# Patient Record
Sex: Male | Born: 1955 | Race: White | Hispanic: No | Marital: Married | Smoking: Current every day smoker
Health system: Southern US, Community
[De-identification: ages and names within clinical notes are randomized; demographics above are authoritative.]

## PROBLEM LIST (undated history)

## (undated) DIAGNOSIS — I639 Cerebral infarction, unspecified: Secondary | ICD-10-CM

## (undated) DIAGNOSIS — F329 Major depressive disorder, single episode, unspecified: Secondary | ICD-10-CM

## (undated) DIAGNOSIS — E785 Hyperlipidemia, unspecified: Secondary | ICD-10-CM

## (undated) DIAGNOSIS — F32A Depression, unspecified: Secondary | ICD-10-CM

## (undated) DIAGNOSIS — I10 Essential (primary) hypertension: Secondary | ICD-10-CM

## (undated) DIAGNOSIS — J449 Chronic obstructive pulmonary disease, unspecified: Secondary | ICD-10-CM

## (undated) HISTORY — PX: OTHER SURGICAL HISTORY: SHX169

## (undated) HISTORY — DX: Chronic obstructive pulmonary disease, unspecified: J44.9

## (undated) HISTORY — DX: Hyperlipidemia, unspecified: E78.5

## (undated) HISTORY — DX: Essential (primary) hypertension: I10

## (undated) HISTORY — DX: Cerebral infarction, unspecified: I63.9

## (undated) HISTORY — PX: APPENDECTOMY: SHX54

## (undated) HISTORY — DX: Major depressive disorder, single episode, unspecified: F32.9

## (undated) HISTORY — DX: Depression, unspecified: F32.A

---

## 2011-02-24 DIAGNOSIS — F32A Depression, unspecified: Secondary | ICD-10-CM | POA: Diagnosis present

## 2011-02-24 DIAGNOSIS — J439 Emphysema, unspecified: Secondary | ICD-10-CM | POA: Diagnosis present

## 2011-02-24 DIAGNOSIS — I10 Essential (primary) hypertension: Secondary | ICD-10-CM | POA: Diagnosis present

## 2011-02-24 DIAGNOSIS — Z8673 Personal history of transient ischemic attack (TIA), and cerebral infarction without residual deficits: Secondary | ICD-10-CM | POA: Insufficient documentation

## 2013-01-25 DIAGNOSIS — M5135 Other intervertebral disc degeneration, thoracolumbar region: Secondary | ICD-10-CM

## 2013-01-25 HISTORY — DX: Other intervertebral disc degeneration, thoracolumbar region: M51.35

## 2014-04-17 DIAGNOSIS — E785 Hyperlipidemia, unspecified: Secondary | ICD-10-CM | POA: Insufficient documentation

## 2014-04-17 DIAGNOSIS — I5189 Other ill-defined heart diseases: Secondary | ICD-10-CM | POA: Insufficient documentation

## 2017-05-06 DIAGNOSIS — F3341 Major depressive disorder, recurrent, in partial remission: Secondary | ICD-10-CM | POA: Insufficient documentation

## 2018-05-17 ENCOUNTER — Other Ambulatory Visit: Payer: Self-pay | Admitting: Family Medicine

## 2018-05-17 DIAGNOSIS — Z0000 Encounter for general adult medical examination without abnormal findings: Secondary | ICD-10-CM

## 2018-05-17 DIAGNOSIS — Z87891 Personal history of nicotine dependence: Secondary | ICD-10-CM

## 2018-05-17 DIAGNOSIS — Z122 Encounter for screening for malignant neoplasm of respiratory organs: Secondary | ICD-10-CM

## 2018-05-23 ENCOUNTER — Telehealth: Payer: Self-pay | Admitting: *Deleted

## 2018-05-23 DIAGNOSIS — Z87891 Personal history of nicotine dependence: Secondary | ICD-10-CM

## 2018-05-23 DIAGNOSIS — Z122 Encounter for screening for malignant neoplasm of respiratory organs: Secondary | ICD-10-CM

## 2018-05-23 NOTE — Telephone Encounter (Signed)
Received referral for initial lung cancer screening scan. Contacted patient and obtained smoking history,(current, 34.5 pack year) as well as answering questions related to screening process. Patient denies signs of lung cancer such as weight loss or hemoptysis. Patient denies comorbidity that would prevent curative treatment if lung cancer were found. Patient is scheduled for shared decision making visit and CT scan on 06/07/18 at 130pm.

## 2018-06-07 ENCOUNTER — Ambulatory Visit
Admission: RE | Admit: 2018-06-07 | Discharge: 2018-06-07 | Disposition: A | Discharge status: Home / Self Care | Payer: Medicare Other | Source: Ambulatory Visit | Attending: Nurse Practitioner | Admitting: Nurse Practitioner

## 2018-06-07 ENCOUNTER — Inpatient Hospital Stay: Payer: Medicare Other | Attending: Nurse Practitioner | Admitting: Nurse Practitioner

## 2018-06-07 ENCOUNTER — Encounter: Payer: Self-pay | Admitting: Nurse Practitioner

## 2018-06-07 DIAGNOSIS — Z122 Encounter for screening for malignant neoplasm of respiratory organs: Secondary | ICD-10-CM

## 2018-06-07 DIAGNOSIS — Z87891 Personal history of nicotine dependence: Secondary | ICD-10-CM | POA: Diagnosis not present

## 2018-06-07 NOTE — Progress Notes (Signed)
In accordance with CMS guidelines, patient has met eligibility criteria including age, absence of signs or symptoms of lung cancer.  Social History   Tobacco Use  . Smoking status: Current Every Day Smoker    Packs/day: 0.75    Years: 46.00    Pack years: 34.50    Types: Cigarettes  Substance Use Topics  . Alcohol use: Not on file  . Drug use: Not on file      A shared decision-making session was conducted prior to the performance of CT scan. This includes one or more decision aids, includes benefits and harms of screening, follow-up diagnostic testing, over-diagnosis, false positive rate, and total radiation exposure.   Counseling on the importance of adherence to annual lung cancer LDCT screening, impact of co-morbidities, and ability or willingness to undergo diagnosis and treatment is imperative for compliance of the program.   Counseling on the importance of continued smoking cessation for former smokers; the importance of smoking cessation for current smokers, and information about tobacco cessation interventions have been given to patient including [place] and [facility] quit [place] programs.   Written order for lung cancer screening with LDCT has been given to the patient and any and all questions have been answered to the best of my abilities.    Yearly follow up will be coordinated by Burgess Estelle, Thoracic Navigator.  Beckey Rutter, DNP, AGNP-C [place] [facility] [phone] (work cell) [phone] (office) 06/07/18 2:25 PM

## 2018-06-09 ENCOUNTER — Telehealth: Payer: Self-pay | Admitting: *Deleted

## 2018-06-09 ENCOUNTER — Other Ambulatory Visit: Payer: Self-pay | Admitting: *Deleted

## 2018-06-09 ENCOUNTER — Encounter: Payer: Self-pay | Admitting: Nurse Practitioner

## 2018-06-09 DIAGNOSIS — R911 Solitary pulmonary nodule: Secondary | ICD-10-CM

## 2018-06-09 NOTE — Telephone Encounter (Signed)
Notified patient of LDCT lung cancer screening program results with recommendation for PET scan. Also notified of incidental findings noted below and is encouraged to discuss further with PCP who will receive a copy of this note and/or the CT report. Patient verbalizes understanding. This plan was discussed with Dr. Iona Beard, who is in agreement with the plan.    IMPRESSION: 1. Lung-RADS 4AS, suspicious. Further evaluation with PET-CT is recommended in the near future to better evaluate a nodular area in the left upper lobe which is associated with the wall of a cyst. 2. The "S" modifier above refers to potentially clinically significant non lung cancer related findings. Specifically, there is aortic atherosclerosis, in addition to 2 vessel coronary artery disease. Please note that although the presence of coronary artery calcium documents the presence of coronary artery disease, the severity of this disease and any potential stenosis cannot be assessed on this non-gated CT examination. Assessment for potential risk factor modification, dietary therapy or pharmacologic therapy may be warranted, if clinically indicated. 3. Diffuse bronchial wall thickening with moderate centrilobular and paraseptal emphysema; imaging findings suggestive of underlying COPD.  Aortic Atherosclerosis (ICD10-I70.0) and Emphysema (ICD10-J43.9).

## 2018-06-09 NOTE — Progress Notes (Signed)
Paul Branch, 63 y.o. male who was referred to the lung cancer screening program/low-dose CT for current use of tobacco products.  he had a low-dose chest CT on 06/07/2018 which noted several pulmonary nodules largest of which associated with the periphery of a cystic area in the left upper lobe with volume derived mean diameter of 10.8 mm.  Recommendation to follow-up with PET scan to further evaluate this finding and rule out primary bronchogenic carcinoma/underlying malignancy.  PET scan will assist in medical decision making including possible biopsy and to optimize management.  Patient is followed by nurse navigator, Burgess Estelle, RN and has been referred to cancer center at [facility]

## 2018-06-13 ENCOUNTER — Ambulatory Visit: Payer: Medicare Other

## 2018-06-15 DIAGNOSIS — I700 Atherosclerosis of aorta: Secondary | ICD-10-CM | POA: Insufficient documentation

## 2018-06-15 DIAGNOSIS — R911 Solitary pulmonary nodule: Secondary | ICD-10-CM | POA: Insufficient documentation

## 2018-06-16 ENCOUNTER — Ambulatory Visit: Payer: Medicare Other

## 2018-06-20 ENCOUNTER — Ambulatory Visit
Admission: RE | Admit: 2018-06-20 | Discharge: 2018-06-20 | Disposition: A | Discharge status: Home / Self Care | Payer: Medicare Other | Source: Ambulatory Visit | Attending: Nurse Practitioner | Admitting: Nurse Practitioner

## 2018-06-20 DIAGNOSIS — J432 Centrilobular emphysema: Secondary | ICD-10-CM | POA: Insufficient documentation

## 2018-06-20 DIAGNOSIS — R911 Solitary pulmonary nodule: Secondary | ICD-10-CM

## 2018-06-20 LAB — GLUCOSE, CAPILLARY: GLUCOSE-CAPILLARY: 74 mg/dL (ref 70–99)

## 2018-06-20 MED ORDER — FLUDEOXYGLUCOSE F - 18 (FDG) INJECTION
8.9000 | Freq: Once | INTRAVENOUS | Status: AC | PRN
  Administered 2018-06-20: 9.12 via INTRAVENOUS

## 2018-06-21 ENCOUNTER — Other Ambulatory Visit: Payer: Self-pay | Admitting: *Deleted

## 2018-06-21 DIAGNOSIS — R911 Solitary pulmonary nodule: Secondary | ICD-10-CM

## 2018-06-22 ENCOUNTER — Other Ambulatory Visit: Payer: Self-pay | Admitting: *Deleted

## 2018-06-22 DIAGNOSIS — R911 Solitary pulmonary nodule: Secondary | ICD-10-CM

## 2018-06-23 ENCOUNTER — Other Ambulatory Visit: Payer: Medicare Other

## 2018-06-23 ENCOUNTER — Ambulatory Visit: Payer: Medicare Other | Attending: Nurse Practitioner

## 2018-06-23 DIAGNOSIS — R911 Solitary pulmonary nodule: Secondary | ICD-10-CM | POA: Insufficient documentation

## 2018-06-23 DIAGNOSIS — J449 Chronic obstructive pulmonary disease, unspecified: Secondary | ICD-10-CM | POA: Insufficient documentation

## 2018-06-23 NOTE — Progress Notes (Signed)
Tumor Board Documentation  Paul Branch was presented by Army Chaco, RN at our Tumor Board on 06/23/2018, which included representatives from medical oncology, radiation oncology, surgical oncology, internal medicine, navigation, pathology, radiology, surgical, nutrition, research, pulmonology.  [place] currently presents as an external consult, for discussion with history of the following treatments: active survellience.  Additionally, we reviewed previous medical and familial history, history of present illness, and recent lab results along with all available histopathologic and imaging studies. The tumor board considered available treatment options and made the following recommendations: Active surveillance Surgical evaluation Dr Genevive Bi, if not surgical candidate, scan every 6 months  The following procedures/referrals were also placed: No orders of the defined types were placed in this encounter.   Clinical Trial Status: not discussed   Staging used:    National site-specific guidelines   were discussed with respect to the case.  Tumor board is a meeting of clinicians from various specialty areas who evaluate and discuss patients for whom a multidisciplinary approach is being considered. Final determinations in the plan of care are those of the provider(s). The responsibility for follow up of recommendations given during tumor board is that of the provider.   Today's extended care, comprehensive team conference, Morty was not present for the discussion and was not examined.   Multidisciplinary Tumor Board is a multidisciplinary case peer review process.  Decisions discussed in the Multidisciplinary Tumor Board reflect the opinions of the specialists present at the conference without having examined the patient.  Ultimately, treatment and diagnostic decisions rest with the primary provider(s) and the patient.

## 2018-06-24 ENCOUNTER — Other Ambulatory Visit: Payer: Self-pay

## 2018-06-24 ENCOUNTER — Ambulatory Visit (INDEPENDENT_AMBULATORY_CARE_PROVIDER_SITE_OTHER): Payer: Medicare Other | Admitting: Cardiothoracic Surgery

## 2018-06-24 ENCOUNTER — Encounter: Payer: Self-pay | Admitting: Cardiothoracic Surgery

## 2018-06-24 VITALS — BP 187/127 | HR 92 | Temp 97.5°F | Ht 72.0 in | Wt 176.2 lb

## 2018-06-24 DIAGNOSIS — J438 Other emphysema: Secondary | ICD-10-CM | POA: Diagnosis not present

## 2018-06-24 DIAGNOSIS — Z87891 Personal history of nicotine dependence: Secondary | ICD-10-CM

## 2018-06-24 NOTE — Progress Notes (Signed)
Patient ID: Paul Branch, male   DOB: Jan 11, 1956, 63 y.o.   MRN: [id number]  Chief Complaint  Patient presents with  . New Patient (Initial Visit)     new patient-lung nodule evaluation, referred by our NP Paul Branch    Referred By Paul Branch Reason for Referral left upper lobe cystic lesion with associated nodule  HPI Location, Quality, Duration, Severity, Timing, Context, Modifying Factors, Associated Signs and Symptoms.  Paul Branch is a 63 y.o. male.  He is a 63 year old African-American male with a longstanding smoking history who currently smokes.  He had a CT scan performed as part of the lung screening and that revealed severe upper lobe emphysematous changes and a nodule associated with the left upper lobe cystic lesion.  A PET scan was then performed which revealed uptake in that area.  The radiology report suggested a bronchoscopy for biopsy.  He had some pulmonary function studies done which reveal an FEV1 of 29% of predicted and a DLCO of 52% of predicted.  He states that he gets short of breath with minimal activities.   Past Medical History:  Diagnosis Date  . COPD (chronic obstructive pulmonary disease) ([place])   . Depression   . Hyperlipidemia   . Hypertension   . Stroke [facility])     Past Surgical History:  Procedure Laterality Date  . pinched nerve      neck    History reviewed. No pertinent family history.  Social History Social History   Tobacco Use  . Smoking status: Current Every Day Smoker    Packs/day: 0.75    Years: 46.00    Pack years: 34.50    Types: Cigarettes  . Smokeless tobacco: Never Used  Substance Use Topics  . Alcohol use: Never    Frequency: Never  . Drug use: Never    No Known Allergies  Current Outpatient Medications  Medication Sig Dispense Refill  . albuterol (PROVENTIL HFA;VENTOLIN HFA) 108 (90 Base) MCG/ACT inhaler     . amLODipine (NORVASC) 10 MG tablet Take by mouth.    Marland Kitchen aspirin EC 81 MG tablet Take by mouth.     Marland Kitchen atorvastatin (LIPITOR) 20 MG tablet Take by mouth.    . Fluticasone-Salmeterol (ADVAIR) 250-50 MCG/DOSE AEPB Inhale into the lungs.    . hydrochlorothiazide (HYDRODIURIL) 25 MG tablet Take by mouth.    Marland Kitchen lisinopril (PRINIVIL,ZESTRIL) 40 MG tablet Take by mouth.    . metoprolol succinate (TOPROL-XL) 100 MG 24 hr tablet Take by mouth.    . nicotine (NICODERM CQ - DOSED IN MG/24 HOURS) 14 mg/24hr patch Place onto the skin.    . pramipexole (MIRAPEX) 0.25 MG tablet TAKE 1 TABLET BY MOUTH 1 TO 2 HOURS PRIOR TO BEDTIME    . umeclidinium bromide (INCRUSE ELLIPTA) 62.5 MCG/INH AEPB Inhale into the lungs.    Marland Kitchen zolpidem (AMBIEN) 10 MG tablet Take by mouth.     No current facility-administered medications for this visit.       Review of Systems A complete review of systems was asked and was negative except for the following positive findings none  Blood pressure (!) 187/127, pulse 92, temperature (!) 97.5 F (36.4 C), temperature source Temporal, height 6' (1.829 m), weight 176 lb 3.2 oz (79.9 kg), SpO2 92 %.  Physical Exam CONSTITUTIONAL:  Pleasant, well-developed, well-nourished, and in no acute distress. EYES: Pupils equal and reactive to light, Sclera non-icteric EARS, NOSE, MOUTH AND THROAT:  The oropharynx was clear.  Dentition is good  repair.  Oral mucosa pink and moist. LYMPH NODES:  Lymph nodes in the neck and axillae were normal RESPIRATORY:  Lungs were clear.  Normal respiratory effort without pathologic use of accessory muscles of respiration CARDIOVASCULAR: Heart was regular without murmurs.  There were no carotid bruits. GI: The abdomen was soft, nontender, and nondistended. There were no palpable masses. There was no hepatosplenomegaly. There were normal bowel sounds in all quadrants. GU:  Rectal deferred.   MUSCULOSKELETAL:  Normal muscle strength and tone.  No clubbing or cyanosis.   SKIN:  There were no pathologic skin lesions.  There were no nodules on  palpation. NEUROLOGIC:  Sensation is normal.  Cranial nerves are grossly intact. PSYCH:  Oriented to person, place and time.  Mood and affect are normal.  Data Reviewed CT scan and PET scan  I have personally reviewed the patient's imaging, laboratory findings and medical records.    Assessment    There is a left upper lobe cystic lesion measuring several centimeters.  Around the periphery of this lesion is a more solid component which is PET positive.    Plan    I would like the patient to be seen by our pulmonary medicine colleagues.  I would like them to consider performing a navigational bronchoscopy for diagnosis.  In addition I will schedule him to come back and see me in 3 months with a repeat CT scan of the chest.  I explained to him that his pulmonary function studies were quite poor and that surgical intervention carried a significantly higher risk.  Perhaps when he sees our pulmonary medicine specialist they can help him with his underlying COPD and smoking cessation.  I will continue to follow with him.

## 2018-06-24 NOTE — Patient Instructions (Addendum)
Patient is to return to the office in 3 month with a CT Scan of the lungs without contrast. Refer patient to lung specialist for emphysema and to discuss ENB.   Call the office with any questions or concerns.

## 2018-07-06 ENCOUNTER — Institutional Professional Consult (permissible substitution): Payer: Medicare Other | Admitting: Pulmonary Disease

## 2018-07-07 ENCOUNTER — Encounter: Payer: Self-pay | Admitting: Pulmonary Disease

## 2018-10-12 ENCOUNTER — Telehealth: Payer: Self-pay

## 2018-10-12 NOTE — Telephone Encounter (Signed)
Message left to see if the patient has rescheduled with [facility] yet.

## 2018-10-12 NOTE — Telephone Encounter (Signed)
-----   Message from Nestor Lewandowsky, MD sent at 09/11/2018  7:50 AM EDT ----- Regarding: RE: Recalls He really needs to see Pulmonary.  If they won't see him, can we refer him to [facility] or [facility].  Thanks.  Tim ----- Message ----- From: Lesly Rubenstein, LPN Sent: 07/09/6242   3:30 PM EDT To: Nestor Lewandowsky, MD Subject: Recalls                                        Do you want to see this patient back in April for a follow up. He never was seen at Pulmonary due to finances. He is in recalls for a CT Chest w/o and follow up with you.

## 2018-10-17 ENCOUNTER — Encounter: Payer: Self-pay | Admitting: Emergency Medicine

## 2018-10-17 ENCOUNTER — Ambulatory Visit
Admission: EM | Admit: 2018-10-17 | Discharge: 2018-10-17 | Disposition: A | Discharge status: Home / Self Care | Payer: Medicare Other | Attending: Family Medicine | Admitting: Family Medicine

## 2018-10-17 ENCOUNTER — Other Ambulatory Visit: Payer: Self-pay

## 2018-10-17 DIAGNOSIS — I10 Essential (primary) hypertension: Secondary | ICD-10-CM | POA: Diagnosis not present

## 2018-10-17 DIAGNOSIS — R609 Edema, unspecified: Secondary | ICD-10-CM | POA: Diagnosis not present

## 2018-10-17 NOTE — ED Provider Notes (Signed)
[facility]    CSN: [id number] Arrival date & time: 10/17/18  0834     History   Chief Complaint Chief Complaint  Patient presents with  . Leg Swelling    bilateral    HPI Paul Branch is a 63 y.o. male.   63 yo male with a c/o feet and leg swelling for the past 2-3 days. Denies any injuries, fevers, chills, shortness of breath, chest pains. He does complain of having low energy. States he's been out of his chronic medications which includes a diuretic for the past 2 months. Also states he just now received a call from the pharmacy while waiting in the room, telling him that his chronic medication refills were ready.   The history is provided by the patient.    Past Medical History:  Diagnosis Date  . COPD (chronic obstructive pulmonary disease) ([place])   . Depression   . Hyperlipidemia   . Hypertension   . Stroke [facility])     Patient Active Problem List   Diagnosis Date Noted  . Personal history of tobacco use, presenting hazards to health 06/07/2018    Past Surgical History:  Procedure Laterality Date  . pinched nerve      neck       Home Medications    Prior to Admission medications   Medication Sig Start Date End Date Taking? Authorizing Provider  albuterol (PROVENTIL HFA;VENTOLIN HFA) 108 (90 Base) MCG/ACT inhaler  06/21/18  Yes [provider]  amLODipine (NORVASC) 10 MG tablet Take by mouth. 04/28/18 11/05/18  [provider]  aspirin EC 81 MG tablet Take by mouth. 10/23/10   [provider]  atorvastatin (LIPITOR) 20 MG tablet Take by mouth. 11/03/17 11/03/18  [provider]  Fluticasone-Salmeterol (ADVAIR) 250-50 MCG/DOSE AEPB Inhale into the lungs. 11/03/17   [provider]  hydrochlorothiazide (HYDRODIURIL) 25 MG tablet Take by mouth. 04/28/18 11/05/18  [provider]  lisinopril (PRINIVIL,ZESTRIL) 40 MG tablet Take by mouth. 04/28/18 11/05/18  [provider]  metoprolol  succinate (TOPROL-XL) 100 MG 24 hr tablet Take by mouth. 06/14/18 06/14/19  [provider]  pramipexole (MIRAPEX) 0.25 MG tablet TAKE 1 TABLET BY MOUTH 1 TO 2 HOURS PRIOR TO BEDTIME 10/04/17   [provider]  umeclidinium bromide (INCRUSE ELLIPTA) 62.5 MCG/INH AEPB Inhale into the lungs. 05/27/18   [provider]  zolpidem (AMBIEN) 10 MG tablet Take by mouth. 11/03/17   [provider]    Family History Family History  Problem Relation Age of Onset  . Stroke Mother     Social History Social History   Tobacco Use  . Smoking status: Former Smoker    Packs/day: 0.75    Years: 46.00    Pack years: 34.50    Types: Cigarettes  . Smokeless tobacco: Never Used  . Tobacco comment: wearing patches  Substance Use Topics  . Alcohol use: Never    Frequency: Never  . Drug use: Never     Allergies   Patient has no known allergies.   Review of Systems Review of Systems   Physical Exam Triage Vital Signs ED Triage Vitals  Enc Vitals Group     BP 10/17/18 0851 (!) 179/102     Pulse Rate 10/17/18 0851 82     Resp 10/17/18 0851 18     Temp 10/17/18 0851 98.2 F (36.8 C)     Temp Source 10/17/18 0851 Oral     SpO2 10/17/18 0851 93 %  Weight 10/17/18 0846 173 lb (78.5 kg)     Height 10/17/18 0846 5\' 11"  (1.803 m)     Head Circumference --      Peak Flow --      Pain Score 10/17/18 0846 0     Pain Loc --      Pain Edu? --      Excl. in [place]? --    No data found.  Updated Vital Signs BP (!) 172/104 (BP Location: Right Arm)   Pulse 82   Temp 98.2 F (36.8 C) (Oral)   Resp 18   Ht 5\' 11"  (1.803 m)   Wt 78.5 kg   SpO2 93%   BMI 24.13 kg/m   Visual Acuity Right Eye Distance:   Left Eye Distance:   Bilateral Distance:    Right Eye Near:   Left Eye Near:    Bilateral Near:     Physical Exam Vitals signs and nursing note reviewed.  Constitutional:      General: He is not in acute distress.    Appearance: He is not toxic-appearing  or diaphoretic.  Cardiovascular:     Rate and Rhythm: Normal rate.  Pulmonary:     Effort: Pulmonary effort is normal. No respiratory distress.  Musculoskeletal:     Right lower leg: Edema (1+) present.     Left lower leg: Edema (1+) present.  Neurological:     Mental Status: He is alert.      UC Treatments / Results  Labs (all labs ordered are listed, but only abnormal results are displayed) Labs Reviewed - No data to display  EKG None  Radiology No results found.  Procedures Procedures (including critical care time)  Medications Ordered in UC Medications - No data to display  Initial Impression / Assessment and Plan / UC Course  I have reviewed the triage vital signs and the nursing notes.  Pertinent labs & imaging results that were available during my care of the patient were reviewed by me and considered in my medical decision making (see chart for details).      Final Clinical Impressions(s) / UC Diagnoses   Final diagnoses:  Dependent edema     Discharge Instructions     Re-start your chronic medications today Follow up with your primary care physician as scheduled this week    ED Prescriptions    None     1. diagnosis reviewed with patient 2. Recommendations as above 3. Follow-up prn   Controlled Substance Prescriptions [place] Controlled Substance Registry consulted? Not Applicable   Norval Gable, MD 10/17/18 [phone]

## 2018-10-17 NOTE — Discharge Instructions (Signed)
Re-start your chronic medications today Follow up with your primary care physician as scheduled this week

## 2018-10-17 NOTE — ED Triage Notes (Signed)
Pt c/o bilateral feet and leg swelling. No pain. Started about 2 days ago but worse yesterday. He states that he does not have any energy and has no appetite. "my stomach ain't feeling right either". I have not been taking my medications in 2-3 months. He states that he does not have any refills at the pharmacy but he has an appt with his PCP this week.

## 2018-12-26 ENCOUNTER — Other Ambulatory Visit: Payer: Self-pay

## 2018-12-26 DIAGNOSIS — J438 Other emphysema: Secondary | ICD-10-CM

## 2018-12-26 DIAGNOSIS — Z87891 Personal history of nicotine dependence: Secondary | ICD-10-CM

## 2019-01-12 ENCOUNTER — Telehealth: Payer: Self-pay | Admitting: *Deleted

## 2019-01-12 NOTE — Telephone Encounter (Signed)
Left voicemail in attempt to discuss lack of follow up of lung finding from screening scan. Message encouraged patient to call me back.

## 2019-01-20 ENCOUNTER — Telehealth: Payer: Self-pay | Admitting: *Deleted

## 2019-01-20 ENCOUNTER — Other Ambulatory Visit: Payer: Self-pay | Admitting: Oncology

## 2019-01-20 ENCOUNTER — Telehealth: Payer: Self-pay | Admitting: Oncology

## 2019-01-20 ENCOUNTER — Telehealth: Payer: Self-pay | Admitting: Internal Medicine

## 2019-01-20 DIAGNOSIS — R911 Solitary pulmonary nodule: Secondary | ICD-10-CM

## 2019-01-20 NOTE — Progress Notes (Signed)
  Pulmonary Nodule Clinic Telephone Note  Received referral from PCP, Burgess Estelle lung screening navigator.  Patient was referred through the lung screening program and found to have several pulmonary nodules with the largest in the left upper lobe measuring 10.8 mm in diameter.  PET scan was recommended and completed in January 2020.  PET scan revealed mild to moderate metabolic activity to left upper lobe nodule.  Findings were concerning for bronchogenic carcinoma.  It was recommended to obtain tissue sampling.  He had pulmonary function studies completed which revealed an FEV1 of 29%.  It was recommended he have a navigational bronchoscopy for diagnosis.  Given he has poor pulmonary function studies he would not be a good surgical candidate.  He was lost to follow-up.  I was contacted today to see if patient was agreeable to have additional CT chest for further evaluation.  Patient is agreeable.  The reason why he did not follow-up was due to a large bill he received after being evaluated by thoracic surgery.  We will touch base with authorization team to see if his scan will be covered.  Patient has Medicaid/Medicare.  Per most recent guidelines and recommendations from [place] (2017), this patient requires a CT scan without contrast, ASAP.    I have personally reviewed all patient's previous imaging.   High risk factors include: History of heavy smoking, exposure to asbestos, radium or uranium, personal family history of lung cancer, older age, sex (females greater than males), race (black and native [place] greater than weight), marginal speculation, upper lobe location, multiplicity (less than 5 nodules increases risk for malignancy) and emphysema and/or pulmonary fibrosis.   This recommendation follows the consensus statement: Guidelines for Management of Incidental Pulmonary Nodules Detected on CT Images: From the Fleischner Society 2017; Radiology 2017; 284:228-243.    I  have placed order for CT scan without contrast to be completed approximately ASAP.   I will follow-up with him virtually and our pulmonary nodule clinic once his CT scan results.  Scheduling has been notified of appointment request and will call patient with appointment   Faythe Casa, NP 07/20/2018 2:22 PM

## 2019-01-20 NOTE — Telephone Encounter (Signed)
CT scan with contrast scheduled for 01/23/2019 at 9 AM at outpatient imaging center.  Called and left voicemail on patient's cell phone.  We will try to call back this afternoon if I do not hear back from him.  Faythe Casa, NP 01/20/2019 11:34 AM

## 2019-01-20 NOTE — Telephone Encounter (Signed)
Contacted patient related to lack of follow up for lung screening abnormality. Patient reports that he did not follow up because of large medical bill he received after seeing thoracic surgery even though he has medicare and medicaid. He reports he does not have money to see anyone.  Encouraged to have a follow up CT scan of chest that patient is in agreement with having done. Will discuss with lung nodule coordinator NP and plan to do CT within 1 week. Patient is in agreement with plan.

## 2019-01-23 ENCOUNTER — Other Ambulatory Visit: Payer: Self-pay

## 2019-01-23 ENCOUNTER — Encounter: Payer: Self-pay | Admitting: *Deleted

## 2019-01-23 ENCOUNTER — Ambulatory Visit
Admission: RE | Admit: 2019-01-23 | Discharge: 2019-01-23 | Disposition: A | Discharge status: Home / Self Care | Payer: Medicare Other | Source: Ambulatory Visit | Attending: Oncology | Admitting: Oncology

## 2019-01-23 DIAGNOSIS — R911 Solitary pulmonary nodule: Secondary | ICD-10-CM | POA: Diagnosis not present

## 2019-01-23 NOTE — Progress Notes (Signed)
  Oncology Nurse Navigator Documentation  Navigator Location: [facility] (01/23/19 1500) Referral Date to RadOnc/MedOnc: 01/23/19 (01/23/19 1500) )Navigator Encounter Type: Introductory Phone Call (01/23/19 1500)   Abnormal Finding Date: 06/07/18 (01/23/19 1500)                     Barriers/Navigation Needs: Coordination of Care (01/23/19 1500)   Interventions: Coordination of Care (01/23/19 1500)   Coordination of Care: Appts (01/23/19 1500)        Acuity: Level 2 (01/23/19 1500)   Acuity Level 2: Initial guidance, education and coordination as needed;Educational needs;Assistance expediting appointments (01/23/19 1500)  phone call made to patient to introduce to navigator services and to review upcoming appts. Pt informed about new patient appt with Dr. Mike Gip on Mon 8/24 at 2:30pm. All questions answered during phone call. Contact info given and instructed to call with any further questions or needs. Pt verbalized understanding.    Time Spent with Patient: 30 (01/23/19 1500)

## 2019-01-23 NOTE — Progress Notes (Signed)
Hey hayley,   This was a LDCT patient who was lost to follow-up and needed a repeat CT scan ASAP. Looks like he met with Dr. Genevive Bi already and he wasn't a good surgical canidate. He needs a PET, tumor board and oncology. What are your thoughts?  Sonia Baller

## 2019-01-24 ENCOUNTER — Institutional Professional Consult (permissible substitution): Payer: Medicare Other | Admitting: Internal Medicine

## 2019-01-24 NOTE — Progress Notes (Signed)
MDC 

## 2019-01-26 ENCOUNTER — Other Ambulatory Visit: Payer: Medicare Other

## 2019-01-26 NOTE — Progress Notes (Signed)
Tumor Board Documentation  Paul Branch was presented by Norvel Richards, RN at our Tumor Board on 01/26/2019, which included representatives from medical oncology, radiation oncology, pathology, radiology, surgical, navigation, internal medicine, research, palliative care, pulmonology.  Paul Branch currently presents as a current patient, for discussion with history of the following treatments: active survellience.  Additionally, we reviewed previous medical and familial history, history of present illness, and recent lab results along with all available histopathologic and imaging studies. The tumor board considered available treatment options and made the following recommendations: Biopsy Refer to pulmonology for Navigational Bronchoscopy and brushiing biopsy  The following procedures/referrals were also placed: No orders of the defined types were placed in this encounter.   Clinical Trial Status: not discussed   Staging used:    National site-specific guidelines   were discussed with respect to the case.  Tumor board is a meeting of clinicians from various specialty areas who evaluate and discuss patients for whom a multidisciplinary approach is being considered. Final determinations in the plan of care are those of the provider(s). The responsibility for follow up of recommendations given during tumor board is that of the provider.   Today's extended care, comprehensive team conference, Paul Branch was not present for the discussion and was not examined.   Multidisciplinary Tumor Board is a multidisciplinary case peer review process.  Decisions discussed in the Multidisciplinary Tumor Board reflect the opinions of the specialists present at the conference without having examined the patient.  Ultimately, treatment and diagnostic decisions rest with the primary provider(s) and the patient.

## 2019-01-30 ENCOUNTER — Inpatient Hospital Stay: Payer: Medicare Other | Admitting: Hematology and Oncology

## 2019-01-30 DIAGNOSIS — R911 Solitary pulmonary nodule: Secondary | ICD-10-CM | POA: Insufficient documentation

## 2019-02-01 ENCOUNTER — Telehealth: Payer: Self-pay | Admitting: Pulmonary Disease

## 2019-02-01 ENCOUNTER — Ambulatory Visit: Payer: Medicare Other | Admitting: Hematology and Oncology

## 2019-02-01 NOTE — Telephone Encounter (Signed)

## 2019-02-02 ENCOUNTER — Other Ambulatory Visit: Payer: Self-pay

## 2019-02-02 ENCOUNTER — Ambulatory Visit (INDEPENDENT_AMBULATORY_CARE_PROVIDER_SITE_OTHER): Payer: Medicare Other | Admitting: Pulmonary Disease

## 2019-02-02 ENCOUNTER — Encounter: Payer: Self-pay | Admitting: Pulmonary Disease

## 2019-02-02 ENCOUNTER — Telehealth: Payer: Self-pay | Admitting: Pulmonary Disease

## 2019-02-02 VITALS — BP 160/80 | HR 101 | Temp 98.0°F | Ht 71.0 in | Wt 186.8 lb

## 2019-02-02 DIAGNOSIS — J432 Centrilobular emphysema: Secondary | ICD-10-CM | POA: Diagnosis not present

## 2019-02-02 DIAGNOSIS — R911 Solitary pulmonary nodule: Secondary | ICD-10-CM | POA: Diagnosis not present

## 2019-02-02 DIAGNOSIS — F17210 Nicotine dependence, cigarettes, uncomplicated: Secondary | ICD-10-CM | POA: Diagnosis not present

## 2019-02-02 DIAGNOSIS — Z716 Tobacco abuse counseling: Secondary | ICD-10-CM

## 2019-02-02 DIAGNOSIS — J449 Chronic obstructive pulmonary disease, unspecified: Secondary | ICD-10-CM

## 2019-02-02 MED ORDER — TRELEGY ELLIPTA 100-62.5-25 MCG/INH IN AEPB: 1.0000 | INHALATION_SPRAY | Freq: Every day | RESPIRATORY_TRACT | 0 refills | Status: AC

## 2019-02-02 MED ORDER — TRELEGY ELLIPTA 100-62.5-25 MCG/INH IN AEPB: 1.0000 | INHALATION_SPRAY | Freq: Every day | RESPIRATORY_TRACT | 6 refills | Status: AC

## 2019-02-02 NOTE — Patient Instructions (Signed)
1.  We are scheduling your procedure for September 4 as you requested.  2.  We have changed your inhaler to Trelegy Ellipta 1 inhalation daily this has 3 medications in it.  You can also continue to use your albuterol as needed.  Make sure you rinse your mouth well after use the Trelegy.  3.  Do not use any other inhalers other than the ones noted above.  4.  We will schedule an appointment for after the procedure.

## 2019-02-02 NOTE — Progress Notes (Signed)
[facility]  [address] [place], [place] [address] Phone: [phone]  Fax: [phone]   Clinic Day:  02/03/2019  Referring physician: Sharyne Peach, MD  Chief Complaint: Paul Branch is a 63 y.o. male with a history of pulmonary nodules who is referred in consultation by Dr Salome Holmes for assessment and management.   HPI: The patient has a a > 50 pack year smoking history.  He has been followed in the low dose chest CT lung cancer screening program.   Low dose chest CT on 06/07/2018 revealed Lung-RADS 4AS, suspicious. Further evaluation with PET-CT was recommended to better evaluate a nodular area in the left upper lobe which was associated with the wall of a cyst.  PET scan on 06/20/2018 revealed mild to moderate metabolic activity associated with the left upper lobe nodule. Findings remain concerning for, but not specific for bronchogenic carcinoma.  There was no evidence of metastatic adenopathy. There was centrilobular emphysema in the upper lobes.  He was seen by Dr. Genevive Bi on 06/24/2018. He was not a surgical candidate due to poor PFTs, long standing smoking history, and COPD. He was referred to pulmonology medicine for a navigational bronchoscopy and management of COPD. Three month follow-up was scheduled, but he was lost to follow-up.   Chest CT without contrast on 01/23/2019 revealed interval progression of the left upper lobe pulmonary nodule, now measuring 1.8 x 0.8 x 1.0 cm compared to 1.7 x 0.5 x 0.8 cm previously. Given the interval progression, this remains very concerning for neoplasm.  He had an upper normal paraesophageal lymph node, stable. There were bilateral adrenal adenomas.  There was coronary artery and thoracoabdominal aortic atherosclerosis. There was emphysema.  Symptomatically, he feels pretty good.  He denies any fevers, sweats or weight loss.  He has a chronic cough due to COPD.  He denies any bone pain.   Past Medical History:  Diagnosis Date  . COPD (chronic obstructive pulmonary disease) ([place])   . Depression   . Hyperlipidemia   . Hypertension   . Stroke [facility])     Past Surgical History:  Procedure Laterality Date  . APPENDECTOMY    . pinched nerve      neck    Family History  Problem Relation Age of Onset  . Stroke Mother     Social History:  reports that he has been smoking cigarettes. He has a 11.50 pack-year smoking history. He has never used smokeless tobacco. He reports that he does not drink alcohol or use drugs.He stopped drinking alcohol 40 years ago.  He smoked 1 pack/day beginning in 1972.  He is currently smoking 4-5 cigarettes/day.  He denies any exposure to radiation or toxins.  He is a retired Games developer.  He lives in [place].  The patient is alone today.  Allergies: No Known Allergies  Current Medications: Current Outpatient Medications  Medication Sig Dispense Refill  . amLODipine (NORVASC) 10 MG tablet Take by mouth.    Marland Kitchen aspirin EC 81 MG tablet Take by mouth.    Marland Kitchen atorvastatin (LIPITOR) 20 MG tablet Take by mouth.    . Fluticasone-Umeclidin-Vilant (TRELEGY ELLIPTA) 100-62.5-25 MCG/INH AEPB Inhale 1 puff into the lungs daily. 1 each 6  . hydrochlorothiazide (HYDRODIURIL) 25 MG tablet Take by mouth.    Marland Kitchen lisinopril (PRINIVIL,ZESTRIL) 40 MG tablet Take by mouth.    . metoprolol succinate (TOPROL-XL) 100 MG 24 hr tablet Take by mouth.    . pramipexole (MIRAPEX) 0.25 MG tablet  TAKE 1 TABLET BY MOUTH 1 TO 2 HOURS PRIOR TO BEDTIME    . zolpidem (AMBIEN) 10 MG tablet Take 10 mg by mouth at bedtime as needed.     Marland Kitchen albuterol (PROVENTIL HFA;VENTOLIN HFA) 108 (90 Base) MCG/ACT inhaler     . furosemide (LASIX) 20 MG tablet Take 20 mg by mouth. As needed.  Gets just a few at a time    . nicotine polacrilex (COMMIT) 4 MG lozenge Start 4 weeks prior to Quit Day.  Place 1 lozenge between cheek and gum every 15 minutes for urges     No current facility-administered  medications for this visit.     Review of Systems  Constitutional: Negative.  Negative for chills, diaphoresis, fever, malaise/fatigue and weight loss.       Feels "pretty good".  HENT: Negative.  Negative for congestion, hearing loss, nosebleeds, sinus pain and sore throat.   Eyes: Negative.  Negative for blurred vision, double vision and photophobia.  Respiratory: Positive for cough (chronic) and sputum production (white phlegm). Negative for hemoptysis, shortness of breath and wheezing.   Cardiovascular: Negative.  Negative for chest pain, palpitations, orthopnea, leg swelling and PND.  Gastrointestinal: Negative.  Negative for abdominal pain, blood in stool, constipation, diarrhea, melena, nausea and vomiting.       Colonoscopy 2 years ago.  Genitourinary: Negative.  Negative for dysuria, frequency, hematuria and urgency.  Musculoskeletal: Negative.  Negative for back pain, joint pain and myalgias.  Skin: Negative.  Negative for rash.  Neurological: Negative.  Negative for dizziness, tingling, sensory change, speech change, focal weakness, weakness and headaches.       CVA 2009 with no residual deficits.  Endo/Heme/Allergies: Negative.  Does not bruise/bleed easily.  Psychiatric/Behavioral: Negative.  Negative for depression and memory loss. The patient is not nervous/anxious and does not have insomnia.   All other systems reviewed and are negative.  Performance status (ECOG): 1  Vitals Blood pressure 136/73, pulse (!) 102, temperature (!) 97.3 F (36.3 C), temperature source Tympanic, height 5\' 11"  (1.803 m), weight 187 lb 11.6 oz (85.2 kg), SpO2 90 %.   Physical Exam  Constitutional: He is oriented to person, place, and time. He appears well-developed and well-nourished. No distress.  HENT:  Head: Normocephalic and atraumatic.  Mouth/Throat: Oropharynx is clear and moist. No oropharyngeal exudate.  White cap.  Thin gray hair.  Mask.  Eyes: Pupils are equal, round, and reactive  to light. Conjunctivae and EOM are normal. No scleral icterus.  Brown eyes.  Neck: Normal range of motion. Neck supple. No JVD present.  Cardiovascular: Normal rate, regular rhythm and normal heart sounds.  No murmur heard. Pulmonary/Chest: Effort normal and breath sounds normal. No respiratory distress. He has no wheezes. He has no rales.  Abdominal: Soft. Bowel sounds are normal. He exhibits no distension and no mass. There is no abdominal tenderness. There is no rebound and no guarding.  Musculoskeletal: Normal range of motion.        General: Edema: chronic lower extremity changes.  Lymphadenopathy:    He has no cervical adenopathy.    He has no axillary adenopathy.       Right: No supraclavicular adenopathy present.       Left: No supraclavicular adenopathy present.  Neurological: He is alert and oriented to person, place, and time. He has normal reflexes.  Skin: Skin is warm and dry. No rash noted. He is not diaphoretic. No erythema. No pallor.  Psychiatric: He has a normal mood  and affect. His behavior is normal. Judgment and thought content normal.  Nursing note and vitals reviewed.   No visits with results within 3 Day(s) from this visit.  Latest known visit with results is:  Hospital Outpatient Visit on 06/20/2018  Component Date Value Ref Range Status  . Glucose-Capillary 06/20/2018 74  70 - 99 mg/dL Final    Assessment:  Paul Branch is a 63 y.o. male with a left upper lobe pulmonary nodule.  Low dose chest CT on 06/07/2018 revealed Lung-RADS 4AS, suspicious.  PET scan on 06/20/2018 revealed mild to moderate metabolic activity associated with the left upper lobe nodule. Findings remain concerning for, but not specific for bronchogenic carcinoma.  There was no evidence of metastatic adenopathy. There was centrilobular emphysema in the upper lobes.  Chest CT without contrast on 01/23/2019 revealed interval progression of the left upper lobe pulmonary nodule, now  measuring 1.8 x 0.8 x 1.0 cm compared to 1.7 x 0.5 x 0.8 cm.  He had a stable upper normal paraesophageal lymph node. There were bilateral adrenal adenomas.  He is not a surgical candidate due to poor PFTs, long standing smoking history, and COPD.   Symptomatically, he feels good.  He has a chronic cough.  Exam is unremarkable.  Plan: 1.   Left upper lobe pulmonary nodule             Review entire medical history, diagnosis and management of an enlarging pulmonary nodule.  Discuss plan for tissue sampling.  Patient has an appointment with Dr Patsey Berthold on 02/10/2019 for electromagnetic navigation bronchoscopy (ENB).  Await pathology.  Present at tumor board on 02/16/2019.  If NSCLC, anticipate SBRT as patient not a candidate for surgery. 2.    RTC on 02/16/2019 for review of pathology and discussion regarding direction of therapy.  I discussed the assessment and treatment plan with the patient.  The patient was provided an opportunity to ask questions and all were answered.  The patient agreed with the plan and demonstrated an understanding of the instructions.  The patient was advised to call back if the symptoms worsen or if the condition fails to improve as anticipated.   Lequita Asal, MD, PhD    02/03/2019, 6:24 PM

## 2019-02-02 NOTE — Telephone Encounter (Signed)
Pre admit and covid test scheduled for 1:00 on 02/07/2019. Pt is aware of date/time and voiced his understanding.  Nothing further is needed.

## 2019-02-02 NOTE — H&P (View-Only) (Signed)
Subjective:    Patient ID: Paul Branch, male    DOB: Aug 18, 1955, 63 y.o.   MRN: [id number]  HPI The patient is a 62 year old current smoker (3 to 4 cigarettes/day) who presents for evaluation of a lung nodule as well as for shortness of breath.  Patient was kindly referred by Dr. Nolon Stalls, his primary care physician is Dr. Salome Holmes.  The patient notes that he has had issues with shortness of breath "for a while" and these issues continue to plague him.  Mostly occur during exertion.  He states that he "hit" his albuterol quite often during the day.  He notices wheezing and cough productive of clear to whitish sputum particularly in the mornings.  He has not noticed any loss of appetite or loss of weight if anything he has been gaining weight.  He continues to smoke 3 to 4 cigarettes per day for the 6 months but prior to that had been smoking 1 pack of cigarettes per day since 1972.  He is cognizant of his need to discontinue smoking and has been working on using Nicorette lozenges and patches.  The patient does not endorse chest pain.    With regards to his lung nodule he underwent chest CT for lung cancer screening on December 2019.  This showed nodule in the LEFT upper lobe that is associated with a cystic area the diameter of this nodule was approximately 10 mm, difficult to discern due to the elongated nature of it.  At that time he underwent PET/CT in January 2020 that showed moderate metabolic activity associated with this LEFT upper lobe nodule.  At this point because of the COVID-19 pandemic further work-up was not done.  In addition, there was concern of a percutaneous biopsy due to the cystic area associated with the nodule.  The patient was followed up with CT scanning of the chest 23 January 2019 by this point the nodule in the LEFT upper lobe had progressed in size.  The patient was presented to tumor board and it is recommended that biopsy be entertained.  The patient  understands that he continues to be a high risk for pneumothorax due to his severe emphysema on the upper lobes and the cystic area associated with the nodule.  However, all in all bronchoscopy with navigational assistance is the safest mode to biopsy this nodule.  The procedure was explained in detail to the patient.  He is concerned and would like to follow through with biopsy.  The patient does not describe any fevers, chills or sweats.  He has not had any purulent sputum production or hemoptysis.  He does not describe any orthopnea, paroxysmal nocturnal dyspnea nor lower extremity edema.  Past medical history, surgical history, and family history were reviewed and are as noted.  Social history working history as above.  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  Was stationed in [place].  He states that he got dishonorable discharge.  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  Review of Systems  Constitutional: Negative.   HENT: Negative.   Eyes: Negative.   Respiratory: Positive for cough, shortness of breath and wheezing.   Cardiovascular: Negative.   Gastrointestinal: Negative.   Endocrine: Negative.   Genitourinary: Negative.   Musculoskeletal: Negative.   Skin: Negative.   Allergic/Immunologic: Negative.   Neurological: Negative.   Hematological: Negative.   Psychiatric/Behavioral: Negative.   All  other systems reviewed and are negative.      Objective:   Physical Exam Vitals signs and nursing note reviewed.  Constitutional:      General: He is not in acute distress.    Appearance: Normal appearance. He is normal weight.  HENT:     Head: Normocephalic and atraumatic.     Right Ear: External ear normal.     Left Ear: External ear normal.     Nose:     Comments: Nose/mouth/throat not examined due to masking requirements for COVID 19.  Patient reports edentia Eyes:     General: No scleral icterus.     Conjunctiva/sclera: Conjunctivae normal.     Pupils: Pupils are equal, round, and reactive to light.  Neck:     Musculoskeletal: Neck supple.     Thyroid: No thyromegaly.     Trachea: Trachea and phonation normal.  Cardiovascular:     Rate and Rhythm: Regular rhythm. Tachycardia present.     Pulses: Normal pulses.     Heart sounds: Normal heart sounds, S1 normal and S2 normal.     Comments: Mild tachycardia. Pulmonary:     Effort: Accessory muscle usage (Minimal, chronic) present. No tachypnea, prolonged expiration or respiratory distress.     Breath sounds: No decreased air movement. Examination of the left-upper field reveals wheezing. Wheezing present. No rhonchi or rales.     Comments: Breath sounds distant Abdominal:     General: Abdomen is flat. There is no distension.  Musculoskeletal: Normal range of motion.     Right lower leg: No edema.     Left lower leg: No edema.  Skin:    General: Skin is warm and dry.  Neurological:     General: No focal deficit present.     Mental Status: He is alert and oriented to person, place, and time.  Psychiatric:        Mood and Affect: Mood normal.        Behavior: Behavior normal.        Thought Content: Thought content normal.   Chest CTs were independently reviewed.  PET/CT was reviewed.  Findings are as below:   CT scan of 06/07/2018   PET/CT of 1/13/ 2020  CT scan of the chest 01/23/2019  Assessment & Plan:   1.  LEFT upper lobe nodule: This is associated with a cystic/emphysematous bleb area.  The nodule has been growing consistently and has had moderate FDG avidity by PET/CT performed previously.  Follow-up CT has shown progressive growth concerning for carcinoma.  The patient understands the high risk nature of potential biopsy procedures due to his severe emphysema.  Biopsy with navigational assistance is the safest form to reach this lesion.  Nevertheless there is still a risk for pneumothorax and the patient understands  this.  He understands that if pneumothorax ensues a chest tube may need to be placed to evacuate the pneumothorax.  Benefits, limitations and potential complications of the procedure were discussed with the patient including, but not limited to bleeding, hemoptysis, respiratory failure requiring intubation and/or prolongued mechanical ventilation, infection, pneumothorax (collapse of lung) requiring chest tube placement, stroke from air embolism or even death.  The patient understands all of this and agrees to proceed with the bronchoscopy.  Change her schedule for 4 September in the morning.  Patient will need preoperative COVID testing as per protocol.  2.  COPD severe with centrilobular emphysema: FEV1 was noted to be 0.93 L and FVC 1.79 L with an  FEV1/FVC of 52% on 23 June 2018.  Because of COVID-19 reassessment will have to be postponed for several weeks due to backlog in the PFT lab.  However will make a change on the patient's medications.  He is supposed to be on Advair, Incruse and albuterol.  When I interviewed him, he actually only endorses using the Incruse and albuterol.  To enhance compliance we will have him use Trelegy Ellipta, 1 inhalation daily, and as needed albuterol.  He is not to use Advair nor Incruse.  Prior to the bronchoscopy the patient will be premedicated with DuoNeb.   3.  Tobacco dependence due to cigarettes: Patient was counseled regards to discontinuation of smoking.  Total counseling time 3 to 5 minutes.  He is cognizant of the need to discontinue smoking altogether.  He has been able to decrease tobacco use significantly.  He was commended on his efforts and was also offered currently modalities for smoking cessation.   Thank you for allowing me to participate in this patient's care.  This chart was dictated using voice recognition software/Dragon.  Despite best efforts to proofread, errors can occur which can change the meaning.  Any change was purely unintentional.

## 2019-02-02 NOTE — Telephone Encounter (Signed)
Bronch with nag and cellvizio scheduled for 02/10/2019 at 10:00a. [id number] mass.

## 2019-02-02 NOTE — Progress Notes (Signed)
Subjective:    Patient ID: Paul Branch, male    DOB: 03/05/1956, 63 y.o.   MRN: [id number]  HPI The patient is a 63 year old current smoker (3 to 4 cigarettes/day) who presents for evaluation of a lung nodule as well as for shortness of breath.  Patient was kindly referred by Dr. Nolon Stalls, his primary care physician is Dr. Salome Holmes.  The patient notes that he has had issues with shortness of breath "for a while" and these issues continue to plague him.  Mostly occur during exertion.  He states that he "hit" his albuterol quite often during the day.  He notices wheezing and cough productive of clear to whitish sputum particularly in the mornings.  He has not noticed any loss of appetite or loss of weight if anything he has been gaining weight.  He continues to smoke 3 to 4 cigarettes per day for the 6 months but prior to that had been smoking 1 pack of cigarettes per day since 1972.  He is cognizant of his need to discontinue smoking and has been working on using Nicorette lozenges and patches.  The patient does not endorse chest pain.    With regards to his lung nodule he underwent chest CT for lung cancer screening on December 2019.  This showed nodule in the LEFT upper lobe that is associated with a cystic area the diameter of this nodule was approximately 10 mm, difficult to discern due to the elongated nature of it.  At that time he underwent PET/CT in January 2020 that showed moderate metabolic activity associated with this LEFT upper lobe nodule.  At this point because of the COVID-19 pandemic further work-up was not done.  In addition, there was concern of a percutaneous biopsy due to the cystic area associated with the nodule.  The patient was followed up with CT scanning of the chest 23 January 2019 by this point the nodule in the LEFT upper lobe had progressed in size.  The patient was presented to tumor board and it is recommended that biopsy be entertained.  The patient  understands that he continues to be a high risk for pneumothorax due to his severe emphysema on the upper lobes and the cystic area associated with the nodule.  However, all in all bronchoscopy with navigational assistance is the safest mode to biopsy this nodule.  The procedure was explained in detail to the patient.  He is concerned and would like to follow through with biopsy.  The patient does not describe any fevers, chills or sweats.  He has not had any purulent sputum production or hemoptysis.  He does not describe any orthopnea, paroxysmal nocturnal dyspnea nor lower extremity edema.  Past medical history, surgical history, and family history were reviewed and are as noted.  Social history working history as above.  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  Was stationed in [place].  He states that he got dishonorable discharge.  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  Review of Systems  Constitutional: Negative.   HENT: Negative.   Eyes: Negative.   Respiratory: Positive for cough, shortness of breath and wheezing.   Cardiovascular: Negative.   Gastrointestinal: Negative.   Endocrine: Negative.   Genitourinary: Negative.   Musculoskeletal: Negative.   Skin: Negative.   Allergic/Immunologic: Negative.   Neurological: Negative.   Hematological: Negative.   Psychiatric/Behavioral: Negative.   All  other systems reviewed and are negative.      Objective:   Physical Exam Vitals signs and nursing note reviewed.  Constitutional:      General: He is not in acute distress.    Appearance: Normal appearance. He is normal weight.  HENT:     Head: Normocephalic and atraumatic.     Right Ear: External ear normal.     Left Ear: External ear normal.     Nose:     Comments: Nose/mouth/throat not examined due to masking requirements for COVID 19.  Patient reports edentia Eyes:     General: No scleral icterus.     Conjunctiva/sclera: Conjunctivae normal.     Pupils: Pupils are equal, round, and reactive to light.  Neck:     Musculoskeletal: Neck supple.     Thyroid: No thyromegaly.     Trachea: Trachea and phonation normal.  Cardiovascular:     Rate and Rhythm: Regular rhythm. Tachycardia present.     Pulses: Normal pulses.     Heart sounds: Normal heart sounds, S1 normal and S2 normal.     Comments: Mild tachycardia. Pulmonary:     Effort: Accessory muscle usage (Minimal, chronic) present. No tachypnea, prolonged expiration or respiratory distress.     Breath sounds: No decreased air movement. Examination of the left-upper field reveals wheezing. Wheezing present. No rhonchi or rales.     Comments: Breath sounds distant Abdominal:     General: Abdomen is flat. There is no distension.  Musculoskeletal: Normal range of motion.     Right lower leg: No edema.     Left lower leg: No edema.  Skin:    General: Skin is warm and dry.  Neurological:     General: No focal deficit present.     Mental Status: He is alert and oriented to person, place, and time.  Psychiatric:        Mood and Affect: Mood normal.        Behavior: Behavior normal.        Thought Content: Thought content normal.   Chest CTs were independently reviewed.  PET/CT was reviewed.  Findings are as below:   CT scan of 06/07/2018   PET/CT of 1/13/ 2020  CT scan of the chest 01/23/2019  Assessment & Plan:   1.  LEFT upper lobe nodule: This is associated with a cystic/emphysematous bleb area.  The nodule has been growing consistently and has had moderate FDG avidity by PET/CT performed previously.  Follow-up CT has shown progressive growth concerning for carcinoma.  The patient understands the high risk nature of potential biopsy procedures due to his severe emphysema.  Biopsy with navigational assistance is the safest form to reach this lesion.  Nevertheless there is still a risk for pneumothorax and the patient understands  this.  He understands that if pneumothorax ensues a chest tube may need to be placed to evacuate the pneumothorax.  Benefits, limitations and potential complications of the procedure were discussed with the patient including, but not limited to bleeding, hemoptysis, respiratory failure requiring intubation and/or prolongued mechanical ventilation, infection, pneumothorax (collapse of lung) requiring chest tube placement, stroke from air embolism or even death.  The patient understands all of this and agrees to proceed with the bronchoscopy.  Change her schedule for 4 September in the morning.  Patient will need preoperative COVID testing as per protocol.  2.  COPD severe with centrilobular emphysema: FEV1 was noted to be 0.93 L and FVC 1.79 L with an  FEV1/FVC of 52% on 23 June 2018.  Because of COVID-19 reassessment will have to be postponed for several weeks due to backlog in the PFT lab.  However will make a change on the patient's medications.  He is supposed to be on Advair, Incruse and albuterol.  When I interviewed him, he actually only endorses using the Incruse and albuterol.  To enhance compliance we will have him use Trelegy Ellipta, 1 inhalation daily, and as needed albuterol.  He is not to use Advair nor Incruse.  Prior to the bronchoscopy the patient will be premedicated with DuoNeb.   3.  Tobacco dependence due to cigarettes: Patient was counseled regards to discontinuation of smoking.  Total counseling time 3 to 5 minutes.  He is cognizant of the need to discontinue smoking altogether.  He has been able to decrease tobacco use significantly.  He was commended on his efforts and was also offered currently modalities for smoking cessation.   Thank you for allowing me to participate in this patient's care.  This chart was dictated using voice recognition software/Dragon.  Despite best efforts to proofread, errors can occur which can change the meaning.  Any change was purely unintentional.

## 2019-02-03 ENCOUNTER — Inpatient Hospital Stay: Payer: Medicare Other | Attending: Hematology and Oncology | Admitting: Hematology and Oncology

## 2019-02-03 ENCOUNTER — Encounter: Payer: Self-pay | Admitting: Hematology and Oncology

## 2019-02-03 VITALS — BP 136/73 | HR 102 | Temp 97.3°F | Ht 71.0 in | Wt 187.7 lb

## 2019-02-03 DIAGNOSIS — D3502 Benign neoplasm of left adrenal gland: Secondary | ICD-10-CM | POA: Insufficient documentation

## 2019-02-03 DIAGNOSIS — R05 Cough: Secondary | ICD-10-CM

## 2019-02-03 DIAGNOSIS — F17210 Nicotine dependence, cigarettes, uncomplicated: Secondary | ICD-10-CM | POA: Insufficient documentation

## 2019-02-03 DIAGNOSIS — Z8673 Personal history of transient ischemic attack (TIA), and cerebral infarction without residual deficits: Secondary | ICD-10-CM | POA: Diagnosis not present

## 2019-02-03 DIAGNOSIS — J449 Chronic obstructive pulmonary disease, unspecified: Secondary | ICD-10-CM | POA: Diagnosis not present

## 2019-02-03 DIAGNOSIS — Z823 Family history of stroke: Secondary | ICD-10-CM | POA: Diagnosis not present

## 2019-02-03 DIAGNOSIS — Z79899 Other long term (current) drug therapy: Secondary | ICD-10-CM | POA: Diagnosis not present

## 2019-02-03 DIAGNOSIS — D3501 Benign neoplasm of right adrenal gland: Secondary | ICD-10-CM | POA: Diagnosis not present

## 2019-02-03 DIAGNOSIS — I700 Atherosclerosis of aorta: Secondary | ICD-10-CM | POA: Diagnosis not present

## 2019-02-03 DIAGNOSIS — R911 Solitary pulmonary nodule: Secondary | ICD-10-CM

## 2019-02-03 NOTE — Telephone Encounter (Signed)
02/03/2019 at 8:40 am EST spoke with [facility] at [facility].  Procedure code [phone] is a Valid and billable code which does not require PA. Call Ref # [id number]. Rhonda J Cobb

## 2019-02-03 NOTE — Progress Notes (Signed)
The patient does not c/o of any problems today

## 2019-02-07 ENCOUNTER — Other Ambulatory Visit: Payer: Self-pay

## 2019-02-07 ENCOUNTER — Other Ambulatory Visit
Admission: RE | Admit: 2019-02-07 | Discharge: 2019-02-07 | Disposition: A | Discharge status: Home / Self Care | Payer: Medicare Other | Source: Ambulatory Visit | Attending: Pulmonary Disease | Admitting: Pulmonary Disease

## 2019-02-07 LAB — SARS CORONAVIRUS 2 (TAT 6-24 HRS): SARS Coronavirus 2: NEGATIVE

## 2019-02-07 NOTE — Patient Instructions (Signed)
Your procedure is scheduled on: Friday 9/4 Report to Day Surgery. To find out your arrival time please call [phone] between 1PM - 3PM on Thurs. 9/3.  Remember: Instructions that are not followed completely may result in serious medical risk,  up to and including death, or upon the discretion of your surgeon and anesthesiologist your  surgery may need to be rescheduled.     _X__ 1. Do not eat food after midnight the night before your procedure.                 No gum chewing or hard candies. You may drink clear liquids up to 2 hours                 before you are scheduled to arrive for your surgery- DO not drink clear                 liquids within 2 hours of the start of your surgery.                 Clear Liquids include:  water, apple juice without pulp, clear carbohydrate                 drink such as Clearfast of Gatorade, Black Coffee or Tea (Do not add                 anything to coffee or tea).  __X__2.  On the morning of surgery brush your teeth with toothpaste and water, you                may rinse your mouth with mouthwash if you wish.  Do not swallow any toothpaste of mouthwash.     ___ 3.  No Alcohol for 24 hours before or after surgery.   _X__ 4.  Do Not Smoke or use e-cigarettes For 24 Hours Prior to Your Surgery.                 Do not use any chewable tobacco products for at least 6 hours prior to                 surgery.  ____  5.  Bring all medications with you on the day of surgery if instructed.   __x__  6.  Notify your doctor if there is any change in your medical condition      (cold, fever, infections).     Do not wear jewelry, make-up, hairpins, clips or nail polish. Do not wear lotions, powders, or perfumes. You may wear deodorant. Do not shave 48 hours prior to surgery. Men may shave face and neck. Do not bring valuables to the hospital.    [facility] is not responsible for any belongings or valuables.  Contacts, dentures  or bridgework may not be worn into surgery. Leave your suitcase in the car. After surgery it may be brought to your room. For patients admitted to the hospital, discharge time is determined by your treatment team.   Patients discharged the day of surgery will not be allowed to drive home.   Please read over the following fact sheets that you were given:     __x__ Take these medicines the morning of surgery with A SIP OF WATER:    1. metoprolol succinate (TOPROL-XL) 100 MG 24 hr tablet  2.   3.   4.  5.  6.  ____ Fleet Enema (as directed)   _x___ Use CHG Soap as directed  __x__ Use inhalers on the day of surgeryalbuterol (PROVENTIL HFA;VENTOLIN HFA) 108 (90 Base) MCG/ACT inhaler and bring with you. Fluticasone-Umeclidin-Vilant (TRELEGY ELLIPTA) 100-62.5-25 MCG/INH AEPB ____ Stop metformin 2 days prior to surgery    ____ Take 1/2 of usual insulin dose the night before surgery. No insulin the morning          of surgery.   _x___ Stop aspirin on today  ____ Stop Anti-inflammatories on    ____ Stop supplements until after surgery.    ____ Bring C-Pap to the hospital.

## 2019-02-10 ENCOUNTER — Inpatient Hospital Stay
Admission: AD | Admit: 2019-02-10 | Discharge: 2019-02-11 | DRG: 190 | Disposition: A | Discharge status: Home / Self Care | Payer: Medicare Other | Attending: Internal Medicine | Admitting: Internal Medicine

## 2019-02-10 ENCOUNTER — Ambulatory Visit: Payer: Medicare Other

## 2019-02-10 ENCOUNTER — Other Ambulatory Visit: Payer: Self-pay

## 2019-02-10 ENCOUNTER — Encounter
Admission: AD | Disposition: A | Discharge status: Home / Self Care | Payer: Self-pay | Source: Home / Self Care | Attending: Internal Medicine

## 2019-02-10 ENCOUNTER — Ambulatory Visit: Payer: Medicare Other | Admitting: Certified Registered Nurse Anesthetist

## 2019-02-10 DIAGNOSIS — Z20828 Contact with and (suspected) exposure to other viral communicable diseases: Secondary | ICD-10-CM | POA: Diagnosis present

## 2019-02-10 DIAGNOSIS — I5032 Chronic diastolic (congestive) heart failure: Secondary | ICD-10-CM | POA: Diagnosis present

## 2019-02-10 DIAGNOSIS — J9622 Acute and chronic respiratory failure with hypercapnia: Secondary | ICD-10-CM | POA: Diagnosis present

## 2019-02-10 DIAGNOSIS — R7981 Abnormal blood-gas level: Secondary | ICD-10-CM | POA: Diagnosis present

## 2019-02-10 DIAGNOSIS — R911 Solitary pulmonary nodule: Secondary | ICD-10-CM | POA: Diagnosis not present

## 2019-02-10 DIAGNOSIS — Z716 Tobacco abuse counseling: Secondary | ICD-10-CM

## 2019-02-10 DIAGNOSIS — F17210 Nicotine dependence, cigarettes, uncomplicated: Secondary | ICD-10-CM | POA: Diagnosis present

## 2019-02-10 DIAGNOSIS — J432 Centrilobular emphysema: Secondary | ICD-10-CM | POA: Diagnosis present

## 2019-02-10 DIAGNOSIS — J441 Chronic obstructive pulmonary disease with (acute) exacerbation: Secondary | ICD-10-CM | POA: Diagnosis present

## 2019-02-10 DIAGNOSIS — J9620 Acute and chronic respiratory failure, unspecified whether with hypoxia or hypercapnia: Secondary | ICD-10-CM | POA: Diagnosis present

## 2019-02-10 DIAGNOSIS — Z9889 Other specified postprocedural states: Secondary | ICD-10-CM

## 2019-02-10 HISTORY — PX: ELECTROMAGNETIC NAVIGATION BROCHOSCOPY: SHX5369

## 2019-02-10 LAB — BLOOD GAS, ARTERIAL
Acid-Base Excess: 4 mmol/L — ABNORMAL HIGH (ref 0.0–2.0)
Acid-Base Excess: 7 mmol/L — ABNORMAL HIGH (ref 0.0–2.0)
Acid-Base Excess: 5.9 mmol/L — ABNORMAL HIGH (ref 0.0–2.0)
Acid-Base Excess: 9.1 mmol/L — ABNORMAL HIGH (ref 0.0–2.0)
Bicarbonate: 37.4 mmol/L — ABNORMAL HIGH (ref 20.0–28.0)
Bicarbonate: 37.2 mmol/L — ABNORMAL HIGH (ref 20.0–28.0)
Bicarbonate: 35.9 mmol/L — ABNORMAL HIGH (ref 20.0–28.0)
Bicarbonate: 38 mmol/L — ABNORMAL HIGH (ref 20.0–28.0)
Delivery systems: POSITIVE
Delivery systems: POSITIVE
Delivery systems: POSITIVE
Expiratory PAP: 6
Expiratory PAP: 8
FIO2: 0.3
FIO2: 0.3
FIO2: 0.3
FIO2: 0.3
Inspiratory PAP: 25
Inspiratory PAP: 18
MECHVT: 450 mL
MECHVT: 450 mL
O2 Saturation: 80.7 %
O2 Saturation: 83.8 %
O2 Saturation: 86.4 %
O2 Saturation: 87.1 %
Patient temperature: 37
Patient temperature: 37
Patient temperature: 37
Patient temperature: 37
RATE: 14 resp/min
pCO2 arterial: 115 mmHg (ref 32.0–48.0)
pCO2 arterial: 83 mmHg (ref 32.0–48.0)
pCO2 arterial: 80 mmHg (ref 32.0–48.0)
pCO2 arterial: 72 mmHg (ref 32.0–48.0)
pH, Arterial: 7.12 — CL (ref 7.350–7.450)
pH, Arterial: 7.26 — ABNORMAL LOW (ref 7.350–7.450)
pH, Arterial: 7.26 — ABNORMAL LOW (ref 7.350–7.450)
pH, Arterial: 7.33 — ABNORMAL LOW (ref 7.350–7.450)
pO2, Arterial: 60 mmHg — ABNORMAL LOW (ref 83.0–108.0)
pO2, Arterial: 56 mmHg — ABNORMAL LOW (ref 83.0–108.0)
pO2, Arterial: 60 mmHg — ABNORMAL LOW (ref 83.0–108.0)
pO2, Arterial: 57 mmHg — ABNORMAL LOW (ref 83.0–108.0)

## 2019-02-10 LAB — CBC WITH DIFFERENTIAL/PLATELET
Abs Immature Granulocytes: 0.05 10*3/uL (ref 0.00–0.07)
Basophils Absolute: 0 10*3/uL (ref 0.0–0.1)
Basophils Relative: 0 %
Eosinophils Absolute: 0 10*3/uL (ref 0.0–0.5)
Eosinophils Relative: 0 %
HCT: 49.6 % (ref 39.0–52.0)
Hemoglobin: 14.7 g/dL (ref 13.0–17.0)
Immature Granulocytes: 0 %
Lymphocytes Relative: 3 %
Lymphs Abs: 0.4 10*3/uL — ABNORMAL LOW (ref 0.7–4.0)
MCH: 30.2 pg (ref 26.0–34.0)
MCHC: 29.6 g/dL — ABNORMAL LOW (ref 30.0–36.0)
MCV: 102.1 fL — ABNORMAL HIGH (ref 80.0–100.0)
Monocytes Absolute: 0.1 10*3/uL (ref 0.1–1.0)
Monocytes Relative: 1 %
Neutro Abs: 12.6 10*3/uL — ABNORMAL HIGH (ref 1.7–7.7)
Neutrophils Relative %: 96 %
Platelets: 206 10*3/uL (ref 150–400)
RBC: 4.86 MIL/uL (ref 4.22–5.81)
RDW: 13.3 % (ref 11.5–15.5)
WBC: 13.2 10*3/uL — ABNORMAL HIGH (ref 4.0–10.5)
nRBC: 0 % (ref 0.0–0.2)

## 2019-02-10 LAB — MAGNESIUM: Magnesium: 2.1 mg/dL (ref 1.7–2.4)

## 2019-02-10 LAB — BRAIN NATRIURETIC PEPTIDE: B Natriuretic Peptide: 76 pg/mL (ref 0.0–100.0)

## 2019-02-10 LAB — BASIC METABOLIC PANEL
Anion gap: 8 (ref 5–15)
BUN: 11 mg/dL (ref 8–23)
CO2: 29 mmol/L (ref 22–32)
Calcium: 8.5 mg/dL — ABNORMAL LOW (ref 8.9–10.3)
Chloride: 105 mmol/L (ref 98–111)
Creatinine, Ser: 1.07 mg/dL (ref 0.61–1.24)
GFR calc Af Amer: >60 mL/min (ref >60)
GFR calc non Af Amer: >60 mL/min (ref >60)
Glucose, Bld: 109 mg/dL — ABNORMAL HIGH (ref 70–99)
Potassium: 4.9 mmol/L (ref 3.5–5.1)
Sodium: 142 mmol/L (ref 135–145)

## 2019-02-10 LAB — MRSA PCR SCREENING: MRSA by PCR: NEGATIVE

## 2019-02-10 LAB — GLUCOSE, CAPILLARY: Glucose-Capillary: 104 mg/dL — ABNORMAL HIGH (ref 70–99)

## 2019-02-10 SURGERY — ELECTROMAGNETIC NAVIGATION BRONCHOSCOPY
Anesthesia: General | Laterality: Left

## 2019-02-10 MED ORDER — ASPIRIN EC 81 MG PO TBEC
81.0000 mg | DELAYED_RELEASE_TABLET | Freq: Every day | ORAL | Status: DC
  Administered 2019-02-10 – 2019-02-11 (×2): 81 mg via ORAL
  Filled 2019-02-10 (×2): qty 1

## 2019-02-10 MED ORDER — ONDANSETRON HCL 4 MG/2ML IJ SOLN: 4.0000 mg | Freq: Once | INTRAMUSCULAR | Status: DC | PRN

## 2019-02-10 MED ORDER — CHLORHEXIDINE GLUCONATE 0.12 % MT SOLN
15.0000 mL | Freq: Two times a day (BID) | OROMUCOSAL | Status: DC
  Administered 2019-02-10 – 2019-02-11 (×2): 15 mL via OROMUCOSAL
  Filled 2019-02-10 (×2): qty 15

## 2019-02-10 MED ORDER — FENTANYL CITRATE (PF) 100 MCG/2ML IJ SOLN
INTRAMUSCULAR | Status: DC | PRN
  Administered 2019-02-10: 25 ug via INTRAVENOUS
  Administered 2019-02-10: 75 ug via INTRAVENOUS

## 2019-02-10 MED ORDER — CHLORHEXIDINE GLUCONATE CLOTH 2 % EX PADS
6.0000 | MEDICATED_PAD | Freq: Every day | CUTANEOUS | Status: DC
  Administered 2019-02-10: 14:00:00 6 via TOPICAL

## 2019-02-10 MED ORDER — LIDOCAINE HCL (CARDIAC) PF 100 MG/5ML IV SOSY
PREFILLED_SYRINGE | INTRAVENOUS | Status: DC | PRN
  Administered 2019-02-10: 50 mg via INTRAVENOUS

## 2019-02-10 MED ORDER — ONDANSETRON HCL 4 MG/2ML IJ SOLN
INTRAMUSCULAR | Status: DC | PRN
  Administered 2019-02-10: 4 mg via INTRAVENOUS

## 2019-02-10 MED ORDER — ROCURONIUM BROMIDE 100 MG/10ML IV SOLN
INTRAVENOUS | Status: DC | PRN
  Administered 2019-02-10: 10 mg via INTRAVENOUS
  Administered 2019-02-10: 20 mg via INTRAVENOUS

## 2019-02-10 MED ORDER — DEXAMETHASONE SODIUM PHOSPHATE 10 MG/ML IJ SOLN
INTRAMUSCULAR | Status: DC | PRN
  Administered 2019-02-10: 10 mg via INTRAVENOUS

## 2019-02-10 MED ORDER — ALBUTEROL SULFATE (2.5 MG/3ML) 0.083% IN NEBU
INHALATION_SOLUTION | RESPIRATORY_TRACT | Status: AC
  Filled 2019-02-10: qty 3

## 2019-02-10 MED ORDER — IPRATROPIUM-ALBUTEROL 0.5-2.5 (3) MG/3ML IN SOLN
3.0000 mL | Freq: Four times a day (QID) | RESPIRATORY_TRACT | Status: DC
  Administered 2019-02-10 – 2019-02-11 (×3): 3 mL via RESPIRATORY_TRACT
  Filled 2019-02-10 (×4): qty 3

## 2019-02-10 MED ORDER — IPRATROPIUM-ALBUTEROL 0.5-2.5 (3) MG/3ML IN SOLN
3.0000 mL | Freq: Once | RESPIRATORY_TRACT | Status: AC
  Administered 2019-02-10: 3 mL via RESPIRATORY_TRACT

## 2019-02-10 MED ORDER — MIDAZOLAM HCL 2 MG/2ML IJ SOLN
INTRAMUSCULAR | Status: AC
  Filled 2019-02-10: qty 2

## 2019-02-10 MED ORDER — BUTAMBEN-TETRACAINE-BENZOCAINE 2-2-14 % EX AERO
1.0000 | INHALATION_SPRAY | Freq: Once | CUTANEOUS | Status: DC
  Filled 2019-02-10: qty 20

## 2019-02-10 MED ORDER — ESMOLOL HCL 100 MG/10ML IV SOLN
INTRAVENOUS | Status: DC | PRN
  Administered 2019-02-10 (×2): 20 mg via INTRAVENOUS
  Administered 2019-02-10: 10 mg via INTRAVENOUS

## 2019-02-10 MED ORDER — PHENYLEPHRINE HCL (PRESSORS) 10 MG/ML IV SOLN
INTRAVENOUS | Status: AC
  Filled 2019-02-10: qty 1

## 2019-02-10 MED ORDER — IPRATROPIUM-ALBUTEROL 0.5-2.5 (3) MG/3ML IN SOLN
3.0000 mL | Freq: Once | RESPIRATORY_TRACT | Status: AC
  Administered 2019-02-10: 12:00:00 3 mL via RESPIRATORY_TRACT

## 2019-02-10 MED ORDER — SODIUM CHLORIDE 0.9 % IV SOLN
INTRAVENOUS | Status: DC | PRN
  Administered 2019-02-10: 10:00:00 via INTRAVENOUS

## 2019-02-10 MED ORDER — ALBUTEROL SULFATE (2.5 MG/3ML) 0.083% IN NEBU: 2.5000 mg | INHALATION_SOLUTION | Freq: Once | RESPIRATORY_TRACT | Status: DC

## 2019-02-10 MED ORDER — PHENYLEPHRINE HCL (PRESSORS) 10 MG/ML IV SOLN
INTRAVENOUS | Status: DC | PRN
  Administered 2019-02-10: 100 ug via INTRAVENOUS
  Administered 2019-02-10 (×2): 200 ug via INTRAVENOUS
  Administered 2019-02-10 (×2): 100 ug via INTRAVENOUS
  Administered 2019-02-10 (×2): 200 ug via INTRAVENOUS

## 2019-02-10 MED ORDER — BUDESONIDE 0.5 MG/2ML IN SUSP
0.5000 mg | Freq: Two times a day (BID) | RESPIRATORY_TRACT | Status: DC
  Administered 2019-02-10 – 2019-02-11 (×2): 0.5 mg via RESPIRATORY_TRACT
  Filled 2019-02-10 (×2): qty 2

## 2019-02-10 MED ORDER — ENOXAPARIN SODIUM 40 MG/0.4ML ~~LOC~~ SOLN
40.0000 mg | SUBCUTANEOUS | Status: DC
  Administered 2019-02-10: 22:00:00 40 mg via SUBCUTANEOUS
  Filled 2019-02-10: qty 0.4

## 2019-02-10 MED ORDER — IPRATROPIUM-ALBUTEROL 0.5-2.5 (3) MG/3ML IN SOLN
RESPIRATORY_TRACT | Status: AC
  Filled 2019-02-10: qty 3

## 2019-02-10 MED ORDER — PROPOFOL 10 MG/ML IV BOLUS
INTRAVENOUS | Status: AC
  Filled 2019-02-10: qty 20

## 2019-02-10 MED ORDER — IPRATROPIUM-ALBUTEROL 0.5-2.5 (3) MG/3ML IN SOLN
RESPIRATORY_TRACT | Status: AC
  Administered 2019-02-10: 12:00:00 3 mL via RESPIRATORY_TRACT
  Filled 2019-02-10: qty 3

## 2019-02-10 MED ORDER — SUCCINYLCHOLINE CHLORIDE 20 MG/ML IJ SOLN
INTRAMUSCULAR | Status: AC
  Filled 2019-02-10: qty 1

## 2019-02-10 MED ORDER — ESMOLOL HCL 100 MG/10ML IV SOLN
INTRAVENOUS | Status: AC
  Filled 2019-02-10: qty 10

## 2019-02-10 MED ORDER — ONDANSETRON HCL 4 MG/2ML IJ SOLN: 4.0000 mg | Freq: Four times a day (QID) | INTRAMUSCULAR | Status: DC | PRN

## 2019-02-10 MED ORDER — SUGAMMADEX SODIUM 200 MG/2ML IV SOLN
INTRAVENOUS | Status: DC | PRN
  Administered 2019-02-10 (×2): 200 mg via INTRAVENOUS

## 2019-02-10 MED ORDER — SODIUM CHLORIDE 0.9 % IV SOLN
INTRAVENOUS | Status: DC | PRN
  Administered 2019-02-10: 11:00:00 60 ug/min via INTRAVENOUS

## 2019-02-10 MED ORDER — ONDANSETRON HCL 4 MG/2ML IJ SOLN
INTRAMUSCULAR | Status: AC
  Filled 2019-02-10: qty 2

## 2019-02-10 MED ORDER — NALOXONE HCL 2 MG/2ML IJ SOSY
PREFILLED_SYRINGE | INTRAMUSCULAR | Status: AC
  Administered 2019-02-10: 0.4 mg via INTRAVENOUS
  Filled 2019-02-10: qty 2

## 2019-02-10 MED ORDER — SUCCINYLCHOLINE CHLORIDE 20 MG/ML IJ SOLN
INTRAMUSCULAR | Status: DC | PRN
  Administered 2019-02-10: 140 mg via INTRAVENOUS

## 2019-02-10 MED ORDER — FENTANYL CITRATE (PF) 100 MCG/2ML IJ SOLN
INTRAMUSCULAR | Status: AC
  Filled 2019-02-10: qty 2

## 2019-02-10 MED ORDER — METHYLPREDNISOLONE SODIUM SUCC 40 MG IJ SOLR
40.0000 mg | Freq: Two times a day (BID) | INTRAMUSCULAR | Status: DC
  Administered 2019-02-10 – 2019-02-11 (×3): 40 mg via INTRAVENOUS
  Filled 2019-02-10 (×3): qty 1

## 2019-02-10 MED ORDER — PROPOFOL 10 MG/ML IV BOLUS
INTRAVENOUS | Status: DC | PRN
  Administered 2019-02-10: 160 mg via INTRAVENOUS

## 2019-02-10 MED ORDER — ROCURONIUM BROMIDE 50 MG/5ML IV SOLN
INTRAVENOUS | Status: AC
  Filled 2019-02-10: qty 1

## 2019-02-10 MED ORDER — NALOXONE HCL 2 MG/2ML IJ SOSY
0.4000 mg | PREFILLED_SYRINGE | Freq: Once | INTRAMUSCULAR | Status: AC
  Administered 2019-02-10: 12:00:00 0.4 mg via INTRAVENOUS

## 2019-02-10 MED ORDER — SODIUM CHLORIDE 0.9 % IV SOLN
Freq: Once | INTRAVENOUS | Status: AC
  Administered 2019-02-10: 20 mL via INTRAVENOUS

## 2019-02-10 MED ORDER — NALOXONE HCL 0.4 MG/ML IJ SOLN
0.4000 mg | INTRAMUSCULAR | Status: DC | PRN
  Filled 2019-02-10: qty 1

## 2019-02-10 MED ORDER — EPHEDRINE SULFATE 50 MG/ML IJ SOLN
INTRAMUSCULAR | Status: DC | PRN
  Administered 2019-02-10: 5 mg via INTRAVENOUS
  Administered 2019-02-10: 2.5 mg via INTRAVENOUS

## 2019-02-10 MED ORDER — ESMOLOL HCL 100 MG/10ML IV SOLN
INTRAVENOUS | Status: DC | PRN
  Administered 2019-02-10: 20 ug/kg/min via INTRAVENOUS

## 2019-02-10 MED ORDER — SODIUM CHLORIDE (PF) 0.9 % IJ SOLN
INTRAMUSCULAR | Status: AC
  Filled 2019-02-10: qty 10

## 2019-02-10 MED ORDER — ORAL CARE MOUTH RINSE
15.0000 mL | Freq: Two times a day (BID) | OROMUCOSAL | Status: DC
  Administered 2019-02-10: 17:00:00 15 mL via OROMUCOSAL

## 2019-02-10 MED ORDER — FENTANYL CITRATE (PF) 100 MCG/2ML IJ SOLN: 25.0000 ug | INTRAMUSCULAR | Status: DC | PRN

## 2019-02-10 MED ORDER — NALOXONE HCL 0.4 MG/ML IJ SOLN
INTRAMUSCULAR | Status: DC | PRN
  Administered 2019-02-10: 0.2 mg via INTRAVENOUS

## 2019-02-10 MED ORDER — MIDAZOLAM HCL 2 MG/2ML IJ SOLN
INTRAMUSCULAR | Status: DC | PRN
  Administered 2019-02-10: 1 mg via INTRAVENOUS

## 2019-02-10 MED ORDER — FUROSEMIDE 10 MG/ML IJ SOLN
20.0000 mg | Freq: Once | INTRAMUSCULAR | Status: AC
  Administered 2019-02-10: 14:00:00 20 mg via INTRAVENOUS
  Filled 2019-02-10: qty 2

## 2019-02-10 MED ORDER — ACETAMINOPHEN 325 MG PO TABS: 650.0000 mg | ORAL_TABLET | ORAL | Status: DC | PRN

## 2019-02-10 NOTE — Transfer of Care (Signed)
Immediate Anesthesia Transfer of Care Note  Patient: Paul Branch  Procedure(s) Performed: ELECTROMAGNETIC NAVIGATION BRONCHOSCOPY, LEFT (Left )  Patient Location: PACU  Anesthesia Type:General  Level of Consciousness: sedated  Airway & Oxygen Therapy: Patient Spontanous Breathing and Patient connected to face mask oxygen  Post-op Assessment: Report given to RN and Post -op Vital signs reviewed and stable  Post vital signs: Reviewed and stable  Last Vitals:  Vitals Value Taken Time  BP 154/93 02/10/19 1157  Temp    Pulse 88 02/10/19 1203  Resp 16 02/10/19 1203  SpO2 93 % 02/10/19 1203  Vitals shown include unvalidated device data.  Last Pain:  Vitals:   02/10/19 1157  TempSrc:   PainSc: 0-No pain         Complications: No apparent anesthesia complications

## 2019-02-10 NOTE — Progress Notes (Signed)
63 yo male with hx of COPD and Current Everyday Smoker presented to [facility] on 09/4 for scheduled biopsy of left upper lobe lung nodule via navigational bronchoscopy.  Post procedure pt extubated, however pt encephalopathic post extubation. He received 0.4 mg iv narcan x1 dose without improvement of symptoms.  ABG revealed severe hypercapnia with pCO2 @115 , pt placed on Bipap.  Following initiation of Bipap pts mentation improved.  He was subsequently admitted to the stepdown unit for additional workup and treatment.  Upon arrival to the stepdown unit he remains on Bipap and endorses he is short of breath.  Upon examination pt alert and oriented, with diminished breath sounds throughout and expiratory wheezes in bilateral upper lobes, sinus rhythm on cardiac monitor, 1+ bilateral lower extremity edema, and JVD present.  Will continue Bipap for now, start scheduled and prn duonebs, iv and nebulized steroids, and repeat ABG for treatment of acute hypercapnic respiratory failure secondary to sedating medications and AECOPD.  Will repeat CXR in am.  Attempted to update pts wife via telephone, however her phone went straight to voicemail will attempt to call her back later.  Will continue to monitor and assess pt.  Marda Stalker, [place] Pager [phone] (please enter 7 digits) PCCM Consult Pager [phone] (please enter 7 digits)

## 2019-02-10 NOTE — Anesthesia Post-op Follow-up Note (Signed)
Anesthesia QCDR form completed.        

## 2019-02-10 NOTE — Progress Notes (Signed)
Shift summary:  - Patient admitted to ICU from PACU s/p Bronchoscopy for BiPAP. -

## 2019-02-10 NOTE — Op Note (Addendum)
1.Electromagnetic Navigation Bronchoscopy 2. Confocal Laser Endomicroscopy  Operator: Renold Don, MD Assist: Frederich Cha RRT  Indication: LEFT upper lobe nodule with associated bulla.  Nodule is FDG avid, rule out CA  Preoperative Diagnosis: LEFT upper lobe nodule Post Procedure Diagnosis: Same as above Consent: Verbal/Written  Benefits, limitations and potential complications of the procedure were discussed with the patient/family  including, but not limited to bleeding, hemoptysis, respiratory failure requiring intubation and/or prolongued mechanical ventilation, infection, pneumothorax (collapse of lung) requiring chest tube placement, stroke from air embolism or even death.  The patient agreed to proceed with electromagnetic navigation bronchoscopy and biopsies as able.  Hand washing performed prior to starting the procedure.   Type of Anesthesia: General endotracheal anesthesia, see anesthesia records  Procedure Performed:  Virtual Bronchoscopy with Multi-planar Image analysis, 3-D reconstruction of coronal, sagittal and multi-planar images for the purposes of planning real-time bronchoscopy using the iLogic Electromagnetic Navigation Bronchoscopy System (superDimension).  Description of Procedure: After appropriate timeout was taken with the staff.  The patient was inducted under general anesthesia by CRNA and anesthesiologist.  Once adequate general anesthesia was achieved, the Olympus video bronchoscope was advanced via a Portex adapter through the existing #8.5 ET tube.  An anatomic review was performed with the bronchoscope prior to utilizing navigation.  Examination of the right upper lobe, right middle lobe and right lower lobes showed no endobronchial lesions.  There were scant thick secretions that were lavaged till clear.  At this point the bronchoscope was brought back to the carina level and advanced to the left and in similar fashion the left upper lobe and lingula and  left lower lobe subsegments were inspected.  The only lesion noted was a submucosal appearing nodule at the takeoff of the left upper lobe bronchus.  This appeared friable.  This was subsequently biopsied as noted below.  Once the anatomic review was performed, a super dimension extended working channel sheath and locatable guide (LG) was introduced through the working channel of the bronchoscope.The LG was then directed to standard registration points at the following centers: main carina, right upper lobe bronchus, right lower lobe bronchus, right middle lobe bronchus, left upper lobe bronchus, and the left lower lobe bronchus. This data was transferred to the i-Logic ENB system for real-time bronchoscopy.  Once registration between the actual bronchoscopic image and CT image were obtained the bronchoscope was navigated to the left upper lobe and target acquisition was attempted.  The target could not be approached on point due to a large bulla present adjacent to the lesion.  This was attempted several times but in all instances the path of the LG was interrupted by the bulla.  This was evident by the CT images and virtual bronchoscopy images.  The probe was placed in the trajectory to the target.  This was corroborated with fluoroscopy.  At this point Cellvizio endo-microscopy was performed the area appeared at times to be devoid of tissue consistent with bulla.  The edge was reached with the Cellvizio probe and tissue of interest was noted however steady images could not be obtained to determine whether these were inflammatory or malignant.  Transbronchial brushings were then obtained in this area with redirection between sampling.  ROSE revealed mostly inflammatory changes.  Once this was completed, targeted bronchoalveolar lavage was performed in this area instilling 40 mL's of saline and obtaining 8 mL's of aliquot.  Sharp tools were not utilized due to the presence of the bulla and to decrease the potential  for pneumothorax.  Once this was completed the bronchoscope was pulled back to the origin of the left upper lobe bronchus and the submucosal lesion noted was then biopsied x8.  ROSE of the tissue was consistent with inflammatory change, formal review pending.  Once this was completed the patient had 9 mL's of 1% lidocaine instilled into the bronchial tree via bronchial lavage.  The bronchoscope was then withdrawn and the procedure was terminated.  It was noted postprocedure that the patient was slow to wake up.  He was able to be extubated in the procedure room however it was noted that he was having significant CO2 retention.  The patient was then transferred to stepdown on BiPAP in stable hemodynamic condition.  He did respond to BiPAP and became more alert.  Of note PaCO2 will had risen to 115.  Specimens Obtained:  Fluoroscopic guided transbronchial brushings LEFT upper lobe: X2  Targeted bronchoalveolar lavage LEFT upper lobe: Aliquot 8 mL's  Endobronchial forceps biopsies LEFT upper lobe: X8.   Complications: CO2 retention with need for noninvasive ventilation as noted above.  This is resolving.  Overall despite this issue the patient tolerated the procedure well.    Portable chest x-ray postprocedure showed no pneumothorax, area of blushing on the LEFT upper lobe consistent with instrumentation.  Mild volume overload.  Case was discussed with Dr. Weston Anna and Marda Stalker, NP as they will be following the patient in the stepdown unit.  Estimated Blood Loss: Nil  Assessment and Plan/Additional Comments: 1.  LEFT upper lobe nodule status post transbronchial brushings/BAL, await pathology reports. 2.  Acute on chronic hypercarbic respiratory failure on BiPAP, observation stepdown unit, wean as tolerated 3.  COPD on the basis of emphysema, mild exacerbation, nebulization treatments and pulmonary toilet, steroids as needed    patient did receive 10 mg of Decadron preop. 4.  Mild volume  overload: Lasix 20 mg IV x1.  Patient's wife and daughter are aware that the patient will be observed in stepdown overnight.  Renold Don, MD [place] [facility] Advanced Bronchoscopy

## 2019-02-10 NOTE — Progress Notes (Signed)
-  Patient has severe COPD with chronic Resp failure. Patient needs NIV therapy, Patient  has a history of Life threatening hospitalizations resulting from her severe COPD -This patient requires access to ventilator therapy 24 hrs/day. Patient will need daily use at night and for the most part of the day -If patient does not receive noninvasive ventilator, patient will likely succumb to sever injury and/or death from increased levels of carbon dioxide    Patient continues to exhibit signs of hypercapnia associated with chronic respiratory failure secondary to severe diastolic heart failure.  Patient requires the use of non-invasive ventilation both nightly and daytime to help with exacerbation periods.    The use of the NIV will treat patient's high PCO2 levels and can reduce risk of exacerbations in future hospitalizations when used at night and during the day.  Patient will need these advanced settings in conjunction with her current medication regimen   BiPAP is not an option due to its functional limitations and the severity of the patient's condition.  Failure to have NIV available for use over 24-hour.  Could lead to cardiac arrest and death     Corrin Parker, M.D.  [facility]  Medical Director [place] Director [facility] Cardio-Pulmonary Department

## 2019-02-10 NOTE — Progress Notes (Signed)
RT assisted with patient transport from PACU to ICU room 20 with no complications.

## 2019-02-10 NOTE — Anesthesia Postprocedure Evaluation (Signed)
Anesthesia Post Note  Patient: Brendt Dible  Procedure(s) Performed: ELECTROMAGNETIC NAVIGATION BRONCHOSCOPY, LEFT (Left )  Patient location during evaluation: PACU Anesthesia Type: General Level of consciousness: awake and alert Pain management: pain level controlled Vital Signs Assessment: post-procedure vital signs reviewed and stable Respiratory status: spontaneous breathing, nonlabored ventilation, respiratory function stable and patient connected to nasal cannula oxygen Cardiovascular status: blood pressure returned to baseline and stable Postop Assessment: no apparent nausea or vomiting Anesthetic complications: no     Last Vitals:  Vitals:   02/10/19 1350 02/10/19 1400  BP: (!) 144/98   Pulse: (!) 102 100  Resp: (!) 28 (!) 30  Temp:    SpO2: 92% (!) 89%    Last Pain:  Vitals:   02/10/19 1331  TempSrc: Axillary  PainSc:                  Molli Barrows

## 2019-02-10 NOTE — Anesthesia Procedure Notes (Signed)
Procedure Name: Intubation Date/Time: 02/10/2019 10:02 AM Performed by: Caryl Asp, CRNA Pre-anesthesia Checklist: Patient identified, Patient being monitored, Timeout performed, Emergency Drugs available and Suction available Patient Re-evaluated:Patient Re-evaluated prior to induction Oxygen Delivery Method: Circle system utilized Preoxygenation: Pre-oxygenation with 100% oxygen Induction Type: IV induction Ventilation: Mask ventilation without difficulty Laryngoscope Size: McGraph and 4 Grade View: Grade I Tube type: Oral Tube size: 8.5 mm Number of attempts: 1 Airway Equipment and Method: Stylet Placement Confirmation: ETT inserted through vocal cords under direct vision,  positive ETCO2 and breath sounds checked- equal and bilateral Secured at: 22 cm Tube secured with: Tape Dental Injury: Teeth and Oropharynx as per pre-operative assessment

## 2019-02-10 NOTE — Anesthesia Preprocedure Evaluation (Signed)
Anesthesia Evaluation  Patient identified by MRN, date of birth, ID band Patient awake    Reviewed: Allergy & Precautions, H&P , NPO status , Patient's Chart, lab work & pertinent test results, reviewed documented beta blocker date and time   Airway Mallampati: II  TM Distance: >3 FB Neck ROM: full    Dental  (+) Teeth Intact   Pulmonary neg shortness of breath, COPD,  COPD inhaler, Current Smoker and Patient abstained from smoking.,    Pulmonary exam normal        Cardiovascular Exercise Tolerance: Poor hypertension, On Medications negative cardio ROS Normal cardiovascular exam Rhythm:regular Rate:Normal     Neuro/Psych PSYCHIATRIC DISORDERS Depression CVA, No Residual Symptoms    GI/Hepatic negative GI ROS, Neg liver ROS,   Endo/Other  negative endocrine ROS  Renal/GU negative Renal ROS  negative genitourinary   Musculoskeletal   Abdominal   Peds  Hematology negative hematology ROS (+)   Anesthesia Other Findings Past Medical History: No date: COPD (chronic obstructive pulmonary disease) (HCC) No date: Depression No date: Hyperlipidemia No date: Hypertension No date: Stroke [facility]) Past Surgical History: No date: APPENDECTOMY No date: pinched nerve      Comment:  neck BMI    Body Mass Index: 25.20 kg/m     Reproductive/Obstetrics negative OB ROS                             Anesthesia Physical Anesthesia Plan  ASA: III  Anesthesia Plan: General ETT   Post-op Pain Management:    Induction:   PONV Risk Score and Plan:   Airway Management Planned:   Additional Equipment:   Intra-op Plan:   Post-operative Plan:   Informed Consent: I have reviewed the patients History and Physical, chart, labs and discussed the procedure including the risks, benefits and alternatives for the proposed anesthesia with the patient or authorized representative who has indicated his/her  understanding and acceptance.     Dental Advisory Given  Plan Discussed with: CRNA  Anesthesia Plan Comments:         Anesthesia Quick Evaluation

## 2019-02-10 NOTE — Plan of Care (Signed)

## 2019-02-10 NOTE — Interval H&P Note (Signed)
History and Physical Interval Note:  02/10/2019 9:16 AM  Paul Branch  has presented today for surgery, with the diagnosis of LUNG MASS.  The various methods of treatment have been discussed with the patient and family. After consideration of risks, benefits and other options for treatment, the patient has consented to  Procedure(s): ELECTROMAGNETIC NAVIGATION BRONCHOSCOPY, LEFT (Left) as a surgical intervention.  The patient's history has been reviewed, patient examined, no change in status, stable for surgery.  I have reviewed the patient's chart and labs.  Questions were answered to the patient's satisfaction.     Renold Don, MD [facility] Advanced Bronchoscopy

## 2019-02-11 DIAGNOSIS — J441 Chronic obstructive pulmonary disease with (acute) exacerbation: Principal | ICD-10-CM

## 2019-02-11 DIAGNOSIS — J9620 Acute and chronic respiratory failure, unspecified whether with hypoxia or hypercapnia: Secondary | ICD-10-CM

## 2019-02-11 LAB — BLOOD GAS, ARTERIAL
Acid-Base Excess: 7.8 mmol/L — ABNORMAL HIGH (ref 0.0–2.0)
Bicarbonate: 36.1 mmol/L — ABNORMAL HIGH (ref 20.0–28.0)
Delivery systems: POSITIVE
Expiratory PAP: 8
FIO2: 0.45
Inspiratory PAP: 18
O2 Saturation: 96.3 %
Patient temperature: 37
RATE: 18 resp/min
pCO2 arterial: 67 mmHg (ref 32.0–48.0)
pH, Arterial: 7.34 — ABNORMAL LOW (ref 7.350–7.450)
pO2, Arterial: 89 mmHg (ref 83.0–108.0)

## 2019-02-11 LAB — BASIC METABOLIC PANEL
Anion gap: 11 (ref 5–15)
BUN: 18 mg/dL (ref 8–23)
CO2: 29 mmol/L (ref 22–32)
Calcium: 8.5 mg/dL — ABNORMAL LOW (ref 8.9–10.3)
Chloride: 102 mmol/L (ref 98–111)
Creatinine, Ser: 1.06 mg/dL (ref 0.61–1.24)
GFR calc Af Amer: >60 mL/min (ref >60)
GFR calc non Af Amer: >60 mL/min (ref >60)
Glucose, Bld: 131 mg/dL — ABNORMAL HIGH (ref 70–99)
Potassium: 5 mmol/L (ref 3.5–5.1)
Sodium: 142 mmol/L (ref 135–145)

## 2019-02-11 LAB — CBC
HCT: 44.9 % (ref 39.0–52.0)
Hemoglobin: 13.6 g/dL (ref 13.0–17.0)
MCH: 30.5 pg (ref 26.0–34.0)
MCHC: 30.3 g/dL (ref 30.0–36.0)
MCV: 100.7 fL — ABNORMAL HIGH (ref 80.0–100.0)
Platelets: 188 10*3/uL (ref 150–400)
RBC: 4.46 MIL/uL (ref 4.22–5.81)
RDW: 13.2 % (ref 11.5–15.5)
WBC: 9.3 10*3/uL (ref 4.0–10.5)
nRBC: 0 % (ref 0.0–0.2)

## 2019-02-11 NOTE — Progress Notes (Signed)
SYNOPSIS COPD exacerbation Left upper lobe mass s/p ENB  CC  COPD exacerbation   HPI resp distress improved Wean off biPAP Assess for oxygen Plan to d/c home today      VITALS: BP 130/84   Pulse 75   Temp 98.3 F (36.8 C) (Axillary)   Resp (!) 22   Ht 6' (1.829 m)   Wt 83.6 kg   SpO2 96%   BMI 25.00 kg/m     I/O last 3 completed shifts: In: 6314 [P.O.:60; I.V.:982] Out: 1200 [Urine:1200] No intake/output data recorded.  SpO2: 96 % O2 Flow Rate (L/min): 3 L/min FiO2 (%): 35 %   Review of Systems:  Gen:  Denies  fever, sweats, chills weigh loss  HEENT: Denies blurred vision, double vision, ear pain, eye pain, hearing loss, nose bleeds, sore throat Cardiac:  No dizziness, chest pain or heaviness, chest tightness,edema, No JVD Resp:   No cough, -sputum production, +shortness of breath,+wheezing, -hemoptysis,  Gi: Denies swallowing difficulty, stomach pain, nausea or vomiting, diarrhea, constipation, bowel incontinence Gu:  Denies bladder incontinence, burning urine Ext:   Denies Joint pain, stiffness or swelling Skin: Denies  skin rash, easy bruising or bleeding or hives Endoc:  Denies polyuria, polydipsia , polyphagia or weight change Psych:   Denies depression, insomnia or hallucinations  Other:  All other systems negative  Physical Examination:   GENERAL:NAD, no fevers, chills, no weakness no fatigue HEAD: Normocephalic, atraumatic.  EYES: PERLA, EOMI No scleral icterus.  MOUTH: Moist mucosal membrane.  EAR, NOSE, THROAT: Clear without exudates. No external lesions.  NECK: Supple.  PULMONARY: CTA B/L no wheezing, rhonchi, crackles CARDIOVASCULAR: S1 and S2. Regular rate and rhythm. No murmurs GASTROINTESTINAL: Soft, nontender, nondistended. Positive bowel sounds.  MUSCULOSKELETAL: No swelling, clubbing, or edema.  NEUROLOGIC: No gross focal neurological deficits. 5/5 strength all extremities SKIN: No ulceration, lesions, rashes, or cyanosis.   PSYCHIATRIC: Insight, judgment intact. -depression -anxiety ALL OTHER ROS ARE NEGATIVE    I personally reviewed Labs under Results section.   MEDICATIONS: I have reviewed all medications and confirmed regimen as documented   CULTURE RESULTS   Recent Results (from the past 240 hour(s))  SARS CORONAVIRUS 2 (TAT 6-24 HRS) Nasopharyngeal Nasopharyngeal Swab     Status: None   Collection Time: 02/07/19 12:43 PM   Specimen: Nasopharyngeal Swab  Result Value Ref Range Status   SARS Coronavirus 2 NEGATIVE NEGATIVE Final    Comment: (NOTE) SARS-CoV-2 target nucleic acids are NOT DETECTED. The SARS-CoV-2 RNA is generally detectable in upper and lower respiratory specimens during the acute phase of infection. Negative results do not preclude SARS-CoV-2 infection, do not rule out co-infections with other pathogens, and should not be used as the sole basis for treatment or other patient management decisions. Negative results must be combined with clinical observations, patient history, and epidemiological information. The expected result is Negative. Fact Sheet for Patients: SugarRoll.be Fact Sheet for Healthcare Providers: https://www.woods-mathews.com/ This test is not yet approved or cleared by the [place] FDA and  has been authorized for detection and/or diagnosis of SARS-CoV-2 by FDA under an Emergency Use Authorization (EUA). This EUA will remain  in effect (meaning this test can be used) for the duration of the COVID-19 declaration under Section 56 4(b)(1) of the Act, 21 U.S.C. section 360bbb-3(b)(1), unless the authorization is terminated or revoked sooner. Performed at [facility], [place] [address] [place], [place] [address]   MRSA PCR Screening     Status: None  Collection Time: 02/10/19  1:39 PM   Specimen: Nasopharyngeal  Result Value Ref Range Status   MRSA by PCR NEGATIVE NEGATIVE Final    Comment:        The  GeneXpert MRSA Assay (FDA approved for NASAL specimens only), is one component of a comprehensive MRSA colonization surveillance program. It is not intended to diagnose MRSA infection nor to guide or monitor treatment for MRSA infections. Performed at [facility], [address], [place], [place] [address]           IMAGING    Dg Chest Port 1 View  Result Date: 02/10/2019 CLINICAL DATA:  Status post bronchoscopy. EXAM: PORTABLE CHEST 1 VIEW COMPARISON:  Chest CT dated 01/23/2019 FINDINGS: Interval endotracheal tube in satisfactory position. Interval patchy opacity in the left upper lung zone. No pneumothorax. Mild-to-moderate diffuse peribronchial thickening without significant change. Unremarkable bones. IMPRESSION: 1. No pneumothorax following bronchoscopy. 2. Interval patchy opacity in the left upper lung zone, compatible with pulmonary hemorrhage or focal edema following bronchoscopy. Pneumonia could have a similar appearance. 3. Stable mild to moderate bronchitic changes. Electronically Signed   By: Claudie Revering M.D.   On: 02/10/2019 13:31   Dg C-arm 1-60 Min-no Report  Result Date: 02/10/2019 Fluoroscopy was utilized by the requesting physician.  No radiographic interpretation.        ASSESSMENT AND PLAN SYNOPSIS  SEVERE COPD EXACERBATION -continue IV steroids as prescribed -continue NEB THERAPY as prescribed -morphine as needed -wean fio2 as needed and tolerated Resolving From ENB case and poor resp status  Plan to d/c home Assess for home 02  ELECTROLYTES -follow labs as needed -replace as needed -pharmacy consultation and following    DVT/GI PRX ordered TRANSFUSIONS AS NEEDED MONITOR FSBS ASSESS the need for LABS as needed    Patient satisfied with Plan of action and management. All questions answered  Corrin Parker, M.D.  [facility]  Medical Director [place] Director [facility]  Cardio-Pulmonary Department

## 2019-02-11 NOTE — Plan of Care (Addendum)
Discharged; plan of care completed.

## 2019-02-11 NOTE — Discharge Summary (Signed)
Physician Discharge Summary  Patient ID: Paul Branch MRN: [id number] DOB/AGE: 02/17/1956 63 y.o.  Admit date: 02/10/2019 Discharge date: 02/11/2019  Admission Diagnoses:COPD exacerbation  Discharge Diagnoses:  Active Problems:   Elevated CO2 level   Acute on chronic respiratory failure (HCC)   Discharged Condition: STable   Hospital Course:  Patient underwent ENB for LUL mass Patient devolped acute resp failure from COPD exacerbation Patients resp status improved with BiPAP    -Patient has severe COPD with chronic Resp failure. Patient needs NIV therapy, Patient  has a history of Life threatening hospitalizations resulting from her severe COPD -This patient requires access to ventilator therapy 24 hrs/day. Patient will need daily use at night and for the most part of the day -If patient does not receive noninvasive ventilator, patient will likely succumb to sever injury and/or death from increased levels of carbon dioxide    Patient continues to exhibit signs of hypercapnia associated with chronic respiratory failure secondary to severe diastolic heart failure. Patient requires the use of non-invasive ventilation both nightly and daytime to help with exacerbation periods.  The use of the NIV will treat patient's high PCO2 levels and can reduce risk of exacerbations in future hospitalizations when used at night and during the day.Patient will need these advanced settings in conjunction with her current medication regimen  BiPAP is not an option due to its functional limitations and the severity of the patient's condition.Failure to have NIV available for use over 24-hour. Could lead to cardiac arrest and death     Discharge Exam: Blood pressure 116/70, pulse 94, temperature 98 F (36.7 C), temperature source Axillary, resp. rate (!) 27, height 6' (1.829 m), weight 83.6 kg, SpO2 (!) 86 %. Physical Examination:   GENERAL:NAD, no fevers, chills, no weakness  no fatigue HEAD: Normocephalic, atraumatic.  EYES: PERLA, EOMI No scleral icterus.  MOUTH: Moist mucosal membrane.  EAR, NOSE, THROAT: Clear without exudates. No external lesions.  NECK: Supple. No thyromegaly.  No JVD.  PULMONARY: CTA B/L no wheezing, rhonchi, crackles CARDIOVASCULAR: S1 and S2. Regular rate and rhythm. No murmurs GASTROINTESTINAL: Soft, nontender, nondistended. Positive bowel sounds.  MUSCULOSKELETAL: No swelling, clubbing, or edema.  NEUROLOGIC: No gross focal neurological deficits. 5/5 strength all extremities SKIN: No ulceration, lesions, rashes, or cyanosis.  PSYCHIATRIC: Insight, judgment intact. -depression -anxiety ALL OTHER ROS ARE NEGATIVE      Disposition:    Allergies as of 02/11/2019   No Known Allergies     Medication List    TAKE these medications   albuterol 108 (90 Base) MCG/ACT inhaler Commonly known as: VENTOLIN HFA   amLODipine 10 MG tablet Commonly known as: NORVASC Take by mouth.   aspirin EC 81 MG tablet Take by mouth.   atorvastatin 20 MG tablet Commonly known as: LIPITOR Take by mouth.   furosemide 20 MG tablet Commonly known as: LASIX Take 20 mg by mouth. As needed.  Gets just a few at a time   hydrochlorothiazide 25 MG tablet Commonly known as: HYDRODIURIL Take by mouth.   lisinopril 40 MG tablet Commonly known as: ZESTRIL Take by mouth.   metoprolol succinate 100 MG 24 hr tablet Commonly known as: TOPROL-XL Take by mouth.   nicotine polacrilex 4 MG lozenge Commonly known as: COMMIT Start 4 weeks prior to Quit Day.  Place 1 lozenge between cheek and gum every 15 minutes for urges   pramipexole 0.25 MG tablet Commonly known as: MIRAPEX TAKE 1 TABLET BY MOUTH 1 TO 2 HOURS PRIOR  TO BEDTIME   Trelegy Ellipta 100-62.5-25 MCG/INH Aepb Generic drug: Fluticasone-Umeclidin-Vilant Inhale 1 puff into the lungs daily.   zolpidem 10 MG tablet Commonly known as: AMBIEN Take 10 mg by mouth at bedtime as needed.             Durable Medical Equipment  (From admission, onward)         Start     Ordered   02/11/19 1322  For home use only DME oxygen  Once    Question Answer Comment  Length of Need Lifetime   Mode or (Route) Nasal cannula   Liters per Minute 2   Frequency Continuous (stationary and portable oxygen unit needed)   Oxygen conserving device No   Oxygen delivery system Gas      02/11/19 1322         patient to be Discharged home with  Trilogy and home oxygen   Signed: Flora Lipps 02/11/2019, 1:38 PM

## 2019-02-11 NOTE — TOC Transition Note (Signed)
Transition of Care [facility]) - CM/SW Discharge Note   Patient Details  Name: Keawe Marcello MRN: [id number] Date of Birth: 1956-01-22  Transition of Care [facility]) CM/SW Contact:  Latanya Maudlin, RN Phone Number: 02/11/2019, 1:59 PM   Clinical Narrative:  Patient to be discharged per MD order. Patient in need of chronic O2. Patient has COPD diagnosis. Patients saturations qualified for chronic O2. Bedside RN did not have access to qualifying SATS note but this writer was able to get the numbers from bedside RN and place note with their permission. MD has ordered continuous O2. Referral placed with [employer]. Portable tank and concentrator brought to bedside. Patient educated on oxygen management and demonstrated proper teach back. Patient also given 24 hr service line number in case there are issues. Spoke with Brad at [employer] who is working on trilogy. MD has placed note for it and asked that its insurance authorization be expedited so it may be available as soon as possible. Per [employer] they will attempt for delivery by Tuesday. MD aware. Family to transport.     Final next level of care: Home/Self Care Barriers to Discharge: No Barriers Identified   Patient Goals and CMS Choice   CMS Medicare.gov Compare Post Acute Care list provided to:: Patient Choice offered to / list presented to : Patient  Discharge Placement                       Discharge Plan and Services                DME Arranged: Oxygen, NIV DME Agency: [employer] Date DME Agency Contacted: 02/11/19 Time DME Agency Contacted: [id number] Representative spoke with at DME Agency: [place] ([place]) Interventions     Readmission Risk Interventions Readmission Risk Prevention Plan 02/11/2019  Post Dischage Appt Complete  Medication Screening Complete  Transportation Screening Complete

## 2019-02-11 NOTE — Progress Notes (Signed)
SATURATION QUALIFICATIONS: (This note is used to comply with regulatory documentation for home oxygen)  Patient Saturations on Room Air at Rest = 86%  Patient Saturations on Room Air while Ambulating = 84%  Patient Saturations on 2 Liters of oxygen while Ambulating = 90%  Please briefly explain why patient needs home oxygen:acute on chronic resp failure related to COPD.

## 2019-02-11 NOTE — Progress Notes (Signed)
Patient discharged to home today. All belongings returned. Patient is going home with home O2; DME is at bedside. IV removed. D/C paperwork given to patient. F/U appointments with Dr. Mike Gip and Dr. Patsey Berthold scheduled. Patient denies questions or concerns at this time.

## 2019-02-11 NOTE — Plan of Care (Signed)

## 2019-02-12 DIAGNOSIS — M6282 Rhabdomyolysis: Secondary | ICD-10-CM

## 2019-02-12 HISTORY — DX: Rhabdomyolysis: M62.82

## 2019-02-13 MED FILL — Esmolol HCl Inj 100 MG/10ML: INTRAVENOUS | Qty: 10 | Status: AC

## 2019-02-14 ENCOUNTER — Encounter: Payer: Self-pay | Admitting: Pulmonary Disease

## 2019-02-14 LAB — HIV ANTIBODY (ROUTINE TESTING W REFLEX): HIV Screen 4th Generation wRfx: NONREACTIVE

## 2019-02-14 LAB — CYTOLOGY - NON PAP

## 2019-02-14 LAB — SURGICAL PATHOLOGY

## 2019-02-16 ENCOUNTER — Inpatient Hospital Stay: Payer: Medicare Other | Attending: Hematology and Oncology | Admitting: Hematology and Oncology

## 2019-02-16 ENCOUNTER — Other Ambulatory Visit: Payer: Medicare Other

## 2019-02-16 NOTE — Progress Notes (Signed)
Tumor Board Documentation  Paul Branch was presented by Dr Mike Gip at our Tumor Board on 02/16/2019, which included representatives from medical oncology, radiation oncology, pathology, radiology, surgical, surgical oncology, navigation, internal medicine, pharmacy, palliative care, research.  Paul Branch currently presents as a current patient, for Paul Branch with history of the following treatments: active survellience, surgical intervention(s).  Additionally, we reviewed previous medical and familial history, history of present illness, and recent lab results along with all available histopathologic and imaging studies. The tumor board considered available treatment options and made the following recommendations: Radiation therapy (primary modality), Biopsy Possible CT guided Biopsy, Refer to Dr Baruch Gouty for SBRT  The following procedures/referrals were also placed: No orders of the defined types were placed in this encounter.   Clinical Trial Status: not discussed   Staging used: Clinical Stage  AJCC Staging:       Group: Probable Lung Cancer   National site-specific guidelines   were discussed with respect to the case.  Tumor board is a meeting of clinicians from various specialty areas who evaluate and discuss patients for whom a multidisciplinary approach is being considered. Final determinations in the plan of care are those of the provider(s). The responsibility for follow up of recommendations given during tumor board is that of the provider.   Today's extended care, comprehensive team conference, Paul Branch was not present for the discussion and was not examined.   Multidisciplinary Tumor Board is a multidisciplinary case peer review process.  Decisions discussed in the Multidisciplinary Tumor Board reflect the opinions of the specialists present at the conference without having examined the patient.  Ultimately, treatment and diagnostic decisions rest with the primary provider(s) and  the patient.

## 2019-02-19 DIAGNOSIS — G2581 Restless legs syndrome: Secondary | ICD-10-CM | POA: Insufficient documentation

## 2019-02-19 DIAGNOSIS — E049 Nontoxic goiter, unspecified: Secondary | ICD-10-CM

## 2019-02-19 DIAGNOSIS — D3500 Benign neoplasm of unspecified adrenal gland: Secondary | ICD-10-CM | POA: Insufficient documentation

## 2019-02-19 HISTORY — DX: Nontoxic goiter, unspecified: E04.9

## 2019-02-23 ENCOUNTER — Encounter: Payer: Self-pay | Admitting: *Deleted

## 2019-02-23 ENCOUNTER — Ambulatory Visit: Payer: Medicare Other | Admitting: Pulmonary Disease

## 2019-02-23 NOTE — Progress Notes (Signed)
  Oncology Nurse Navigator Documentation  Navigator Location: [facility] (02/23/19 1500)   )Navigator Encounter Type: Telephone (02/23/19 1500) Telephone: Paul Branch Call (02/23/19 1500)                           Interventions: None Required (02/23/19 1500)         phone call made to patient to follow up with him after his biopsy and to see if he wanted to rescheduled any of his missed appointments. Per pt, he is not interested on seeing any more doctors or having any more procedures at this time. Reviewed results and recommendations with patient and informed that it would be beneficial for him to continue to follow up with a provider. Advised that he could follow up with Dr. Mike Gip in the next 2 weeks if desired to discuss next steps. Pt stated that he is tired of doctors and their procedures. States that he is "63 years old and if it is his time then it his time." Contact info given to pt and instructed to call back if would like to reschedule his appt to follow up with Dr. Mike Gip. Pt verbalized understanding. Nothing further needed at this time.              Time Spent with Patient: 30 (02/23/19 1500)

## 2019-02-28 ENCOUNTER — Telehealth: Payer: Self-pay | Admitting: *Deleted

## 2019-02-28 NOTE — Telephone Encounter (Signed)
Contacted patient to discuss plan of care. Patient is agreeable to plan of care that was discussed in our oncology case conference which was for pursuing SBRT without another biopsy attempt due to complications from prior attempt.   Patient also reports since biopsy his oxygen level has been around 88% on RA due to inability to afford Oxygen tanks.

## 2019-02-28 NOTE — Telephone Encounter (Signed)
  He was scheduled to come to clinic to discuss the plan of SBRT, but cancelled.  Does he have an appt with Dr Baruch Gouty?  If so, I am happy to see him back in clinic after radiation.  Just let me know what I can do to help.  M

## 2019-03-01 ENCOUNTER — Telehealth: Payer: Self-pay | Admitting: *Deleted

## 2019-03-01 NOTE — Telephone Encounter (Signed)
Notified of appt with Dr. Baruch Gouty for 03/08/19 at [facility]

## 2019-03-07 ENCOUNTER — Other Ambulatory Visit: Payer: Self-pay

## 2019-03-08 ENCOUNTER — Ambulatory Visit: Payer: Medicare Other | Admitting: Radiation Oncology

## 2019-03-20 ENCOUNTER — Ambulatory Visit: Payer: Medicare Other | Admitting: Radiation Oncology

## 2019-03-30 ENCOUNTER — Ambulatory Visit: Payer: Medicare Other | Attending: Radiation Oncology | Admitting: Radiation Oncology

## 2019-05-01 ENCOUNTER — Encounter: Payer: Self-pay | Admitting: Pulmonary Disease

## 2020-04-07 ENCOUNTER — Observation Stay
Admission: EM | Admit: 2020-04-07 | Discharge: 2020-04-08 | Disposition: A | Discharge status: Home / Self Care | Payer: Medicare HMO | Attending: Internal Medicine | Admitting: Internal Medicine

## 2020-04-07 ENCOUNTER — Emergency Department: Payer: Medicare HMO

## 2020-04-07 ENCOUNTER — Encounter: Payer: Self-pay | Admitting: Emergency Medicine

## 2020-04-07 ENCOUNTER — Other Ambulatory Visit: Payer: Self-pay

## 2020-04-07 DIAGNOSIS — Z7982 Long term (current) use of aspirin: Secondary | ICD-10-CM | POA: Diagnosis not present

## 2020-04-07 DIAGNOSIS — Z20822 Contact with and (suspected) exposure to covid-19: Secondary | ICD-10-CM | POA: Insufficient documentation

## 2020-04-07 DIAGNOSIS — J9610 Chronic respiratory failure, unspecified whether with hypoxia or hypercapnia: Secondary | ICD-10-CM | POA: Diagnosis not present

## 2020-04-07 DIAGNOSIS — F32A Depression, unspecified: Secondary | ICD-10-CM | POA: Diagnosis not present

## 2020-04-07 DIAGNOSIS — F17210 Nicotine dependence, cigarettes, uncomplicated: Secondary | ICD-10-CM | POA: Diagnosis not present

## 2020-04-07 DIAGNOSIS — I10 Essential (primary) hypertension: Secondary | ICD-10-CM | POA: Diagnosis not present

## 2020-04-07 DIAGNOSIS — R0602 Shortness of breath: Secondary | ICD-10-CM | POA: Diagnosis present

## 2020-04-07 DIAGNOSIS — J439 Emphysema, unspecified: Secondary | ICD-10-CM | POA: Diagnosis present

## 2020-04-07 DIAGNOSIS — Z79899 Other long term (current) drug therapy: Secondary | ICD-10-CM | POA: Insufficient documentation

## 2020-04-07 DIAGNOSIS — F17200 Nicotine dependence, unspecified, uncomplicated: Secondary | ICD-10-CM | POA: Diagnosis present

## 2020-04-07 DIAGNOSIS — J441 Chronic obstructive pulmonary disease with (acute) exacerbation: Principal | ICD-10-CM

## 2020-04-07 LAB — COMPREHENSIVE METABOLIC PANEL
ALT: 15 U/L (ref 0–44)
AST: 19 U/L (ref 15–41)
Albumin: 4.1 g/dL (ref 3.5–5.0)
Alkaline Phosphatase: 49 U/L (ref 38–126)
Anion gap: 7 (ref 5–15)
BUN: 8 mg/dL (ref 8–23)
CO2: 32 mmol/L (ref 22–32)
Calcium: 8.8 mg/dL — ABNORMAL LOW (ref 8.9–10.3)
Chloride: 103 mmol/L (ref 98–111)
Creatinine, Ser: 0.79 mg/dL (ref 0.61–1.24)
GFR, Estimated: >60 mL/min (ref >60)
Glucose, Bld: 107 mg/dL — ABNORMAL HIGH (ref 70–99)
Potassium: 5.1 mmol/L (ref 3.5–5.1)
Sodium: 142 mmol/L (ref 135–145)
Total Bilirubin: 0.8 mg/dL (ref 0.3–1.2)
Total Protein: 7.2 g/dL (ref 6.5–8.1)

## 2020-04-07 LAB — CBC
HCT: 48 % (ref 39.0–52.0)
Hemoglobin: 14.7 g/dL (ref 13.0–17.0)
MCH: 30.8 pg (ref 26.0–34.0)
MCHC: 30.6 g/dL (ref 30.0–36.0)
MCV: 100.6 fL — ABNORMAL HIGH (ref 80.0–100.0)
Platelets: 193 10*3/uL (ref 150–400)
RBC: 4.77 MIL/uL (ref 4.22–5.81)
RDW: 13.9 % (ref 11.5–15.5)
WBC: 7.4 10*3/uL (ref 4.0–10.5)
nRBC: 0 % (ref 0.0–0.2)

## 2020-04-07 LAB — TROPONIN I (HIGH SENSITIVITY)
Troponin I (High Sensitivity): 10 ng/L (ref <18)
Troponin I (High Sensitivity): 12 ng/L (ref <18)

## 2020-04-07 LAB — RESPIRATORY PANEL BY RT PCR (FLU A&B, COVID)
Influenza A by PCR: NEGATIVE
Influenza B by PCR: NEGATIVE
SARS Coronavirus 2 by RT PCR: NEGATIVE

## 2020-04-07 MED ORDER — LISINOPRIL 20 MG PO TABS
40.0000 mg | ORAL_TABLET | Freq: Every day | ORAL | Status: DC
  Administered 2020-04-07: 40 mg via ORAL
  Filled 2020-04-07 (×2): qty 2

## 2020-04-07 MED ORDER — ALBUTEROL SULFATE (2.5 MG/3ML) 0.083% IN NEBU: 2.5000 mg | INHALATION_SOLUTION | RESPIRATORY_TRACT | Status: DC | PRN

## 2020-04-07 MED ORDER — AMLODIPINE BESYLATE 10 MG PO TABS
10.0000 mg | ORAL_TABLET | Freq: Every day | ORAL | Status: DC
  Administered 2020-04-07: 10 mg via ORAL
  Filled 2020-04-07 (×2): qty 1

## 2020-04-07 MED ORDER — IPRATROPIUM-ALBUTEROL 0.5-2.5 (3) MG/3ML IN SOLN
3.0000 mL | Freq: Once | RESPIRATORY_TRACT | Status: AC
  Administered 2020-04-07: 3 mL via RESPIRATORY_TRACT
  Filled 2020-04-07: qty 3

## 2020-04-07 MED ORDER — SODIUM CHLORIDE 0.9% FLUSH: 3.0000 mL | INTRAVENOUS | Status: DC | PRN

## 2020-04-07 MED ORDER — METHYLPREDNISOLONE SODIUM SUCC 125 MG IJ SOLR
125.0000 mg | Freq: Once | INTRAMUSCULAR | Status: AC
  Administered 2020-04-07: 125 mg via INTRAVENOUS
  Filled 2020-04-07: qty 2

## 2020-04-07 MED ORDER — NICOTINE 7 MG/24HR TD PT24
7.0000 mg | MEDICATED_PATCH | Freq: Every day | TRANSDERMAL | Status: DC
  Administered 2020-04-07 – 2020-04-08 (×2): 7 mg via TRANSDERMAL
  Filled 2020-04-07 (×3): qty 1

## 2020-04-07 MED ORDER — HYDROCHLOROTHIAZIDE 25 MG PO TABS
25.0000 mg | ORAL_TABLET | Freq: Every day | ORAL | Status: DC
  Administered 2020-04-07: 25 mg via ORAL
  Filled 2020-04-07 (×2): qty 1

## 2020-04-07 MED ORDER — PRAMIPEXOLE DIHYDROCHLORIDE 0.25 MG PO TABS
0.2500 mg | ORAL_TABLET | Freq: Every evening | ORAL | Status: DC
  Administered 2020-04-07: 0.25 mg via ORAL
  Filled 2020-04-07: qty 1

## 2020-04-07 MED ORDER — SODIUM CHLORIDE 0.9% FLUSH
3.0000 mL | Freq: Two times a day (BID) | INTRAVENOUS | Status: DC
  Administered 2020-04-07 – 2020-04-08 (×3): 3 mL via INTRAVENOUS

## 2020-04-07 MED ORDER — IPRATROPIUM-ALBUTEROL 0.5-2.5 (3) MG/3ML IN SOLN
3.0000 mL | Freq: Four times a day (QID) | RESPIRATORY_TRACT | Status: DC
  Administered 2020-04-07 – 2020-04-08 (×5): 3 mL via RESPIRATORY_TRACT
  Filled 2020-04-07 (×4): qty 3

## 2020-04-07 MED ORDER — SODIUM CHLORIDE 0.9 % IV SOLN: 250.0000 mL | INTRAVENOUS | Status: DC | PRN

## 2020-04-07 MED ORDER — BUDESONIDE 0.5 MG/2ML IN SUSP
2.0000 mg | Freq: Two times a day (BID) | RESPIRATORY_TRACT | Status: DC
  Administered 2020-04-07 – 2020-04-08 (×2): 0.5 mg via RESPIRATORY_TRACT
  Filled 2020-04-07 (×2): qty 8

## 2020-04-07 MED ORDER — MELATONIN 5 MG PO TABS
5.0000 mg | ORAL_TABLET | Freq: Every evening | ORAL | Status: DC | PRN
  Administered 2020-04-08: 5 mg via ORAL
  Filled 2020-04-07: qty 1

## 2020-04-07 MED ORDER — UMECLIDINIUM-VILANTEROL 62.5-25 MCG/INH IN AEPB
1.0000 | INHALATION_SPRAY | Freq: Every day | RESPIRATORY_TRACT | Status: DC
  Filled 2020-04-07: qty 14

## 2020-04-07 MED ORDER — ASPIRIN EC 81 MG PO TBEC
81.0000 mg | DELAYED_RELEASE_TABLET | Freq: Every day | ORAL | Status: DC
  Administered 2020-04-07 – 2020-04-08 (×2): 81 mg via ORAL
  Filled 2020-04-07 (×2): qty 1

## 2020-04-07 MED ORDER — ATORVASTATIN CALCIUM 20 MG PO TABS
20.0000 mg | ORAL_TABLET | Freq: Every day | ORAL | Status: DC
  Administered 2020-04-07 – 2020-04-08 (×2): 20 mg via ORAL
  Filled 2020-04-07 (×2): qty 1

## 2020-04-07 MED ORDER — ENOXAPARIN SODIUM 40 MG/0.4ML ~~LOC~~ SOLN
40.0000 mg | SUBCUTANEOUS | Status: DC
  Administered 2020-04-07: 40 mg via SUBCUTANEOUS
  Filled 2020-04-07: qty 0.4

## 2020-04-07 MED ORDER — METOPROLOL SUCCINATE ER 100 MG PO TB24
100.0000 mg | ORAL_TABLET | Freq: Every day | ORAL | Status: DC
  Administered 2020-04-07: 100 mg via ORAL
  Filled 2020-04-07 (×2): qty 1

## 2020-04-07 NOTE — ED Notes (Signed)
Pt placed on 3 L. Pt sating at 89% on 2L.
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
## 2020-04-07 NOTE — Progress Notes (Signed)
Nutrition Brief Note  Patient identified on the Malnutrition Screening Tool (MST) Report  Wt Readings from Last 15 Encounters:  04/07/20 80.7 kg  02/11/19 83.6 kg  02/07/19 84.3 kg  02/03/19 85.2 kg  02/02/19 84.7 kg  10/17/18 78.5 kg  06/24/18 79.9 kg  06/07/18 78 kg   Paul Branch is a 64 y.o. male with medical history significant for COPD with chronic respiratory failure on 2 L of oxygen as needed, history of hypertension, dyslipidemia and CVA who was brought into the ER by EMS for evaluation of shortness of breath.  Pt admitted with COPD exacerbation.   Attempted to speak with pt via call to hospital room phone, however, unable to reach.   Reviewed wt hx; pt has experienced a 3.5% wt loss over the past year, which is not significant for time frame.  Medications reviewed.   Labs reviewed.   Current diet order is 2 gram sodium, patient is consuming approximately n/a% of meals at this time. Labs and medications reviewed.   No nutrition interventions warranted at this time. If nutrition issues arise, please consult RD.   Loistine Chance, RD, LDN, [place] Registered Dietitian II Certified Diabetes Care and Education Specialist Please refer to [facility] for RD and/or RD on-call/weekend/after hours pager

## 2020-04-07 NOTE — ED Notes (Signed)
Updated pt's daughter, Beni Turrell, [phone] with pt's permission.

## 2020-04-07 NOTE — Evaluation (Signed)
Physical Therapy Evaluation Patient Details Name: Paul Branch MRN: [id number] DOB: June 03, 1956 Today's Date: 04/07/2020   History of Present Illness  Pt is a 64 y.o. male with medical history significant for COPD with chronic respiratory failure on 2 L of oxygen as needed, history of hypertension, dyslipidemia and CVA who was brought into the ER by EMS for evaluation of shortness of breath.  MD assessment includes: COPD with acute exacerbation.    Clinical Impression  Pt was pleasant and motivated to participate during the session.  Pt was Ind with all functional tasks with SpO2 with activity as follows.  Baseline on 2LO2/min SpO2 89%, HR 100 bpm; after amb 30' on 2LO2/min SpO2 82%, HR 107; O2 increased to 4LO2/min.  Baseline on 4LO2/min SpO2 90%, HR 102 bpm; after amb 30' SpO2 90%, HR 107 bpm; after amb 80 feet SpO2 91%, HR 111 bpm.  Per nursing request pt left in room on 4LO2/min with nursing to continue to assess and wean as appropriate.  No skilled PT needs identified at this time.  Will complete PT orders at this time but will reassess pt pending a change in status upon receipt of new PT orders.       Follow Up Recommendations No PT follow up    Equipment Recommendations  None recommended by PT    Recommendations for Other Services       Precautions / Restrictions Precautions Precautions: None Restrictions Weight Bearing Restrictions: No      Mobility  Bed Mobility Overal bed mobility: Independent                  Transfers Overall transfer level: Independent Equipment used: None             General transfer comment: Good control and stability  Ambulation/Gait Ambulation/Gait assistance: Independent Gait Distance (Feet): 80 Feet x 1, 30 Feet x 2 Assistive device: None Gait Pattern/deviations: Step-through pattern;Decreased step length - right;Decreased step length - left Gait velocity: decreased   General Gait Details: Min reduced step length and  cadence but steady without LOB  Stairs            Wheelchair Mobility    Modified Rankin (Stroke Patients Only)       Balance Overall balance assessment: No apparent balance deficits (not formally assessed)                                           Pertinent Vitals/Pain Pain Assessment: No/denies pain    Home Living Family/patient expects to be discharged to:: Private residence Living Arrangements: Spouse/significant other Available Help at Discharge: Family;Available 24 hours/day Type of Home: House Home Access: Stairs to enter Entrance Stairs-Rails: Right;Left;Can reach both Entrance Stairs-Number of Steps: 5 Home Layout: One level Home Equipment: Cane - single point      Prior Function Level of Independence: Independent         Comments: Ind amb community distances without an AD, no fall history, Ind with ADLs, 2LO2/min PRN but typically does not use O2 often     Hand Dominance        Extremity/Trunk Assessment   Upper Extremity Assessment Upper Extremity Assessment: Overall WFL for tasks assessed    Lower Extremity Assessment Lower Extremity Assessment: Overall WFL for tasks assessed       Communication   Communication: No difficulties  Cognition Arousal/Alertness: Awake/alert Behavior  During Therapy: WFL for tasks assessed/performed Overall Cognitive Status: Within Functional Limits for tasks assessed                                        General Comments      Exercises Other Exercises Other Exercises: PLB education   Assessment/Plan    PT Assessment Patent does not need any further PT services  PT Problem List         PT Treatment Interventions      PT Goals (Current goals can be found in the Care Plan section)  Acute Rehab PT Goals PT Goal Formulation: All assessment and education complete, DC therapy    Frequency     Barriers to discharge        Co-evaluation                AM-PAC PT "6 Clicks" Mobility  Outcome Measure Help needed turning from your back to your side while in a flat bed without using bedrails?: None Help needed moving from lying on your back to sitting on the side of a flat bed without using bedrails?: None Help needed moving to and from a bed to a chair (including a wheelchair)?: None Help needed standing up from a chair using your arms (e.g., wheelchair or bedside chair)?: None Help needed to walk in hospital room?: None Help needed climbing 3-5 steps with a railing? : None 6 Click Score: 24    End of Session Equipment Utilized During Treatment: Oxygen Activity Tolerance: Patient tolerated treatment well Patient left: in bed;with call bell/phone within reach Nurse Communication: Mobility status;Other (comment) (SpO2 response to activity) PT Visit Diagnosis: Muscle weakness (generalized) (M62.81)    Time: 1655-3748 PT Time Calculation (min) (ACUTE ONLY): 31 min   Charges:   PT Evaluation $PT Eval Low Complexity: 1 Low PT Treatments $Therapeutic Activity: 8-22 mins       D. Royetta Asal PT, DPT 04/07/20, 3:59 PM
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
## 2020-04-07 NOTE — ED Notes (Signed)
Advised nurse that patient has assigned bed 
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
## 2020-04-08 DIAGNOSIS — J441 Chronic obstructive pulmonary disease with (acute) exacerbation: Secondary | ICD-10-CM | POA: Diagnosis not present

## 2020-04-08 LAB — POTASSIUM: Potassium: 4.9 mmol/L (ref 3.5–5.1)

## 2020-04-08 LAB — HIV ANTIBODY (ROUTINE TESTING W REFLEX): HIV Screen 4th Generation wRfx: NONREACTIVE

## 2020-04-08 MED ORDER — FAMOTIDINE 20 MG PO TABS
20.0000 mg | ORAL_TABLET | Freq: Every day | ORAL | Status: DC
  Administered 2020-04-08: 20 mg via ORAL
  Filled 2020-04-08: qty 1

## 2020-04-08 MED ORDER — PREDNISONE 20 MG PO TABS: 40.0000 mg | ORAL_TABLET | Freq: Every day | ORAL | 0 refills | Status: AC

## 2020-04-08 MED ORDER — FAMOTIDINE 20 MG PO TABS: 20.0000 mg | ORAL_TABLET | Freq: Every day | ORAL | 0 refills | Status: AC

## 2020-04-08 MED ORDER — AMLODIPINE BESYLATE 5 MG PO TABS: 5.0000 mg | ORAL_TABLET | Freq: Every day | ORAL | Status: DC

## 2020-04-08 MED ORDER — METHYLPREDNISOLONE SODIUM SUCC 40 MG IJ SOLR
40.0000 mg | Freq: Two times a day (BID) | INTRAMUSCULAR | Status: DC
  Administered 2020-04-08: 40 mg via INTRAVENOUS
  Filled 2020-04-08: qty 1

## 2020-04-08 NOTE — Evaluation (Signed)
Occupational Therapy Evaluation Patient Details Name: Paul Branch MRN: [id number] DOB: 1955-09-23 Today's Date: 04/08/2020    History of Present Illness Pt is a 64 y.o. male with medical history significant for COPD with chronic respiratory failure on 2 L of oxygen as needed, history of hypertension, dyslipidemia and CVA who was brought into the ER by EMS for evaluation of shortness of breath.  MD assessment includes: COPD with acute exacerbation.   Clinical Impression   Pt currently mod I with all functional mobility and self care tasks. He does not utilize device for ambulation. He does use reacher and sock aide at home for LB dressing. Pt educated on energy conservation this session for self care and community mobility tasks with pt asking appropriate questions and engaged in education. Pt lives with wife who is home to assist as needed. Pt has no additional OT needs at this time. OT will SIGN OFF. Thank you for referral. Please re-consult if needs change.    Follow Up Recommendations  No OT follow up    Equipment Recommendations  None recommended by OT       Precautions / Restrictions Precautions Precautions: None      Mobility Bed Mobility Overal bed mobility: Independent    Transfers Overall transfer level: Independent Equipment used: None     General transfer comment: Good control and stability    Balance Overall balance assessment: No apparent balance deficits (not formally assessed)      ADL either performed or assessed with clinical judgement   ADL Overall ADL's : Modified independent         Vision Patient Visual Report: No change from baseline              Pertinent Vitals/Pain Pain Assessment: No/denies pain        Extremity/Trunk Assessment Upper Extremity Assessment Upper Extremity Assessment: Overall WFL for tasks assessed   Lower Extremity Assessment Lower Extremity Assessment: Overall WFL for tasks assessed   Cervical / Trunk  Assessment Cervical / Trunk Assessment: Normal   Communication Communication Communication: No difficulties   Cognition Arousal/Alertness: Awake/alert Behavior During Therapy: WFL for tasks assessed/performed Overall Cognitive Status: Within Functional Limits for tasks assessed                Home Living Family/patient expects to be discharged to:: Private residence Living Arrangements: Spouse/significant other Available Help at Discharge: Family;Available 24 hours/day Type of Home: House Home Access: Stairs to enter [employer] of Steps: 5 Entrance Stairs-Rails: Right;Left;Can reach both Home Layout: One level     Bathroom Shower/Tub: Teacher, early years/pre: Standard     Home Equipment: Cane - single point;Adaptive equipment Adaptive Equipment: Reacher;Sock aid        Prior Functioning/Environment Level of Independence: Independent        Comments: Ind amb community distances without an AD, no fall history, Ind with ADLs, 2LO2/min PRN but typically does not use O2 often                 OT Goals(Current goals can be found in the care plan section) Acute Rehab OT Goals Patient Stated Goal: to go home and quit smoking OT Goal Formulation: With patient  OT Frequency:     Barriers to D/C: Decreased caregiver support  none at this time          AM-PAC OT "6 Clicks" Daily Activity     Outcome Measure Help from another person eating meals?: None Help from  another person taking care of personal grooming?: None Help from another person toileting, which includes using toliet, bedpan, or urinal?: None   Help from another person to put on and taking off regular upper body clothing?: None Help from another person to put on and taking off regular lower body clothing?: A Little 6 Click Score: 19   End of Session Nurse Communication: Mobility status  Activity Tolerance: Patient tolerated treatment well Patient left: in bed;with call  bell/phone within reach                   Time: 0920-0935 OT Time Calculation (min): 15 min Charges:  OT General Charges $OT Visit: 1 Visit OT Evaluation $OT Eval Low Complexity: 1 Low OT Treatments $Self Care/Home Management : 8-22 mins  Darleen Crocker, MS, OTR/L , CBIS ascom [phone]  04/08/20, 10:44 AM
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
## 2020-04-08 NOTE — Progress Notes (Signed)
Paul Branch A and O x4. VSS. Pt tolerating diet well. No complaints of nausea or vomiting. IV removed intact, prescriptions given. Pt voices understanding of discharge instructions with no further questions. Patient discharged via wheelchair with volunteer  Allergies as of 04/08/2020   No Known Allergies     Medication List    STOP taking these medications   amLODipine 10 MG tablet Commonly known as: NORVASC   furosemide 20 MG tablet Commonly known as: LASIX   hydrochlorothiazide 25 MG tablet Commonly known as: HYDRODIURIL   lisinopril 40 MG tablet Commonly known as: ZESTRIL   metoprolol succinate 100 MG 24 hr tablet Commonly known as: TOPROL-XL   zolpidem 10 MG tablet Commonly known as: AMBIEN     TAKE these medications   albuterol 108 (90 Base) MCG/ACT inhaler Commonly known as: VENTOLIN HFA   albuterol (2.5 MG/3ML) 0.083% nebulizer solution Commonly known as: PROVENTIL Take 3 mLs by nebulization in the morning, at noon, in the evening, and at bedtime.   Anoro Ellipta 62.5-25 MCG/INH Aepb Generic drug: umeclidinium-vilanterol Inhale 1 puff into the lungs daily.   aspirin EC 81 MG tablet Take 81 mg by mouth daily.   atorvastatin 20 MG tablet Commonly known as: LIPITOR Take 20 mg by mouth daily.   famotidine 20 MG tablet Commonly known as: PEPCID Take 1 tablet (20 mg total) by mouth daily. Start taking on: April 09, 2020   melatonin 5 MG Tabs Take 5 mg by mouth at bedtime as needed.   pramipexole 0.25 MG tablet Commonly known as: MIRAPEX Take 0.25 mg by mouth every evening.   predniSONE 20 MG tablet Commonly known as: DELTASONE Take 2 tablets (40 mg total) by mouth daily for 6 doses.   Trelegy Ellipta 100-62.5-25 MCG/INH Aepb Generic drug: Fluticasone-Umeclidin-Vilant Inhale 1 puff into the lungs daily.       Vitals:   04/08/20 1004 04/08/20 1150  BP: 98/68 120/81  Pulse: 62 65  Resp: (!) 21 19  Temp: 98 F (36.7 Paul Branch) 98.1 F (36.7 Paul Branch)   SpO2: 94%     Paul Branch Paul Branch Paul Branch

## 2020-04-08 NOTE — Care Management Obs Status (Signed)
[place] NOTIFICATION   Patient Details  Name: Paul Branch MRN: [id number] Date of Birth: 1956-05-30   Medicare Observation Status Notification Given:  Yes    Candie Chroman, LCSW 04/08/2020, 3:32 PM

## 2020-04-08 NOTE — Care Management CC44 (Signed)
Condition Code 44 Documentation Completed  Patient Details  Name: Paul Branch MRN: [id number] Date of Birth: 02/01/1956   Condition Code 44 given:  Yes Patient signature on Condition Code 44 notice:  Yes Documentation of 2 MD's agreement:  Yes Code 44 added to claim:  Yes    Candie Chroman, LCSW 04/08/2020, 3:32 PM

## 2020-04-08 NOTE — Clinical Social Work Note (Addendum)
Patient's wife is out of town. He does not have his oxygen tank here at the hospital and not willing to have anyone go pick it up at his house. Patient gets his oxygen through [place]. CSW called [employer] and they will deliver a tank to get him home. Unsure of ETA.  Dayton Scrape, CSW [phone]  3:44 pm Oxygen was delivered to room around 2:00. Patient has orders to discharge home today. No further concerns. CSW signing off.  Dayton Scrape, [place]

## 2020-09-12 DIAGNOSIS — Z599 Problem related to housing and economic circumstances, unspecified: Secondary | ICD-10-CM | POA: Insufficient documentation

## 2020-09-12 DIAGNOSIS — F17200 Nicotine dependence, unspecified, uncomplicated: Secondary | ICD-10-CM | POA: Diagnosis present

## 2020-09-22 ENCOUNTER — Encounter (HOSPITAL_COMMUNITY): Payer: Self-pay

## 2020-09-22 ENCOUNTER — Inpatient Hospital Stay (HOSPITAL_COMMUNITY)
Admission: EM | Admit: 2020-09-22 | Discharge: 2020-10-06 | DRG: 870 | Disposition: E | Payer: Medicare HMO | Attending: Internal Medicine | Admitting: Internal Medicine

## 2020-09-22 ENCOUNTER — Inpatient Hospital Stay (HOSPITAL_COMMUNITY): Payer: Medicare HMO

## 2020-09-22 ENCOUNTER — Emergency Department (HOSPITAL_COMMUNITY): Payer: Medicare HMO

## 2020-09-22 DIAGNOSIS — J939 Pneumothorax, unspecified: Secondary | ICD-10-CM | POA: Diagnosis not present

## 2020-09-22 DIAGNOSIS — Z20822 Contact with and (suspected) exposure to covid-19: Secondary | ICD-10-CM | POA: Diagnosis present

## 2020-09-22 DIAGNOSIS — R0902 Hypoxemia: Secondary | ICD-10-CM

## 2020-09-22 DIAGNOSIS — J9621 Acute and chronic respiratory failure with hypoxia: Secondary | ICD-10-CM | POA: Diagnosis present

## 2020-09-22 DIAGNOSIS — J181 Lobar pneumonia, unspecified organism: Secondary | ICD-10-CM | POA: Diagnosis present

## 2020-09-22 DIAGNOSIS — A419 Sepsis, unspecified organism: Secondary | ICD-10-CM | POA: Diagnosis present

## 2020-09-22 DIAGNOSIS — R578 Other shock: Secondary | ICD-10-CM | POA: Diagnosis present

## 2020-09-22 DIAGNOSIS — Z9911 Dependence on respirator [ventilator] status: Secondary | ICD-10-CM | POA: Diagnosis not present

## 2020-09-22 DIAGNOSIS — T4275XA Adverse effect of unspecified antiepileptic and sedative-hypnotic drugs, initial encounter: Secondary | ICD-10-CM | POA: Diagnosis not present

## 2020-09-22 DIAGNOSIS — Z823 Family history of stroke: Secondary | ICD-10-CM

## 2020-09-22 DIAGNOSIS — C3412 Malignant neoplasm of upper lobe, left bronchus or lung: Secondary | ICD-10-CM | POA: Diagnosis present

## 2020-09-22 DIAGNOSIS — Z66 Do not resuscitate: Secondary | ICD-10-CM | POA: Diagnosis not present

## 2020-09-22 DIAGNOSIS — I468 Cardiac arrest due to other underlying condition: Secondary | ICD-10-CM | POA: Diagnosis not present

## 2020-09-22 DIAGNOSIS — Z8673 Personal history of transient ischemic attack (TIA), and cerebral infarction without residual deficits: Secondary | ICD-10-CM | POA: Diagnosis not present

## 2020-09-22 DIAGNOSIS — Z7982 Long term (current) use of aspirin: Secondary | ICD-10-CM | POA: Diagnosis not present

## 2020-09-22 DIAGNOSIS — N179 Acute kidney failure, unspecified: Secondary | ICD-10-CM | POA: Diagnosis not present

## 2020-09-22 DIAGNOSIS — J9601 Acute respiratory failure with hypoxia: Secondary | ICD-10-CM

## 2020-09-22 DIAGNOSIS — J189 Pneumonia, unspecified organism: Secondary | ICD-10-CM | POA: Diagnosis not present

## 2020-09-22 DIAGNOSIS — J9622 Acute and chronic respiratory failure with hypercapnia: Secondary | ICD-10-CM | POA: Diagnosis present

## 2020-09-22 DIAGNOSIS — Z79899 Other long term (current) drug therapy: Secondary | ICD-10-CM

## 2020-09-22 DIAGNOSIS — Z9981 Dependence on supplemental oxygen: Secondary | ICD-10-CM | POA: Diagnosis not present

## 2020-09-22 DIAGNOSIS — R6521 Severe sepsis with septic shock: Secondary | ICD-10-CM | POA: Diagnosis present

## 2020-09-22 DIAGNOSIS — Z978 Presence of other specified devices: Secondary | ICD-10-CM

## 2020-09-22 DIAGNOSIS — Z515 Encounter for palliative care: Secondary | ICD-10-CM | POA: Diagnosis not present

## 2020-09-22 DIAGNOSIS — J9690 Respiratory failure, unspecified, unspecified whether with hypoxia or hypercapnia: Secondary | ICD-10-CM

## 2020-09-22 DIAGNOSIS — Z01818 Encounter for other preprocedural examination: Secondary | ICD-10-CM

## 2020-09-22 DIAGNOSIS — J9811 Atelectasis: Secondary | ICD-10-CM | POA: Diagnosis present

## 2020-09-22 DIAGNOSIS — E785 Hyperlipidemia, unspecified: Secondary | ICD-10-CM | POA: Diagnosis present

## 2020-09-22 DIAGNOSIS — J441 Chronic obstructive pulmonary disease with (acute) exacerbation: Secondary | ICD-10-CM

## 2020-09-22 DIAGNOSIS — R911 Solitary pulmonary nodule: Secondary | ICD-10-CM | POA: Diagnosis present

## 2020-09-22 DIAGNOSIS — Z0189 Encounter for other specified special examinations: Secondary | ICD-10-CM

## 2020-09-22 DIAGNOSIS — R0689 Other abnormalities of breathing: Secondary | ICD-10-CM

## 2020-09-22 DIAGNOSIS — I952 Hypotension due to drugs: Secondary | ICD-10-CM | POA: Diagnosis not present

## 2020-09-22 DIAGNOSIS — D696 Thrombocytopenia, unspecified: Secondary | ICD-10-CM | POA: Diagnosis present

## 2020-09-22 DIAGNOSIS — R918 Other nonspecific abnormal finding of lung field: Secondary | ICD-10-CM

## 2020-09-22 DIAGNOSIS — D7589 Other specified diseases of blood and blood-forming organs: Secondary | ICD-10-CM | POA: Diagnosis present

## 2020-09-22 DIAGNOSIS — R34 Anuria and oliguria: Secondary | ICD-10-CM | POA: Diagnosis not present

## 2020-09-22 DIAGNOSIS — Z9689 Presence of other specified functional implants: Secondary | ICD-10-CM

## 2020-09-22 DIAGNOSIS — F17210 Nicotine dependence, cigarettes, uncomplicated: Secondary | ICD-10-CM | POA: Diagnosis present

## 2020-09-22 DIAGNOSIS — I248 Other forms of acute ischemic heart disease: Secondary | ICD-10-CM | POA: Diagnosis present

## 2020-09-22 DIAGNOSIS — N170 Acute kidney failure with tubular necrosis: Secondary | ICD-10-CM | POA: Diagnosis not present

## 2020-09-22 DIAGNOSIS — Z7189 Other specified counseling: Secondary | ICD-10-CM | POA: Diagnosis not present

## 2020-09-22 DIAGNOSIS — J9383 Other pneumothorax: Secondary | ICD-10-CM | POA: Diagnosis not present

## 2020-09-22 DIAGNOSIS — E870 Hyperosmolality and hypernatremia: Secondary | ICD-10-CM | POA: Diagnosis not present

## 2020-09-22 DIAGNOSIS — Z4659 Encounter for fitting and adjustment of other gastrointestinal appliance and device: Secondary | ICD-10-CM

## 2020-09-22 DIAGNOSIS — I10 Essential (primary) hypertension: Secondary | ICD-10-CM | POA: Diagnosis present

## 2020-09-22 DIAGNOSIS — E872 Acidosis: Secondary | ICD-10-CM | POA: Diagnosis present

## 2020-09-22 DIAGNOSIS — Z7951 Long term (current) use of inhaled steroids: Secondary | ICD-10-CM

## 2020-09-22 DIAGNOSIS — R140 Abdominal distension (gaseous): Secondary | ICD-10-CM

## 2020-09-22 DIAGNOSIS — J95811 Postprocedural pneumothorax: Secondary | ICD-10-CM | POA: Diagnosis not present

## 2020-09-22 DIAGNOSIS — R7989 Other specified abnormal findings of blood chemistry: Secondary | ICD-10-CM | POA: Diagnosis present

## 2020-09-22 DIAGNOSIS — Z4682 Encounter for fitting and adjustment of non-vascular catheter: Secondary | ICD-10-CM

## 2020-09-22 DIAGNOSIS — J439 Emphysema, unspecified: Secondary | ICD-10-CM | POA: Diagnosis present

## 2020-09-22 LAB — URINALYSIS, ROUTINE W REFLEX MICROSCOPIC
Bacteria, UA: NONE SEEN
Bilirubin Urine: NEGATIVE
Glucose, UA: NEGATIVE mg/dL
Ketones, ur: NEGATIVE mg/dL
Leukocytes,Ua: NEGATIVE
Nitrite: NEGATIVE
Protein, ur: >=300 mg/dL — AB
Specific Gravity, Urine: 1.023 (ref 1.005–1.030)
pH: 5 (ref 5.0–8.0)

## 2020-09-22 LAB — CBC WITH DIFFERENTIAL/PLATELET
Abs Immature Granulocytes: 0.12 10*3/uL — ABNORMAL HIGH (ref 0.00–0.07)
Basophils Absolute: 0 10*3/uL (ref 0.0–0.1)
Basophils Relative: 0 %
Eosinophils Absolute: 0 10*3/uL (ref 0.0–0.5)
Eosinophils Relative: 0 %
HCT: 56.4 % — ABNORMAL HIGH (ref 39.0–52.0)
Hemoglobin: 16.1 g/dL (ref 13.0–17.0)
Immature Granulocytes: 1 %
Lymphocytes Relative: 1 %
Lymphs Abs: 0.2 10*3/uL — ABNORMAL LOW (ref 0.7–4.0)
MCH: 30.5 pg (ref 26.0–34.0)
MCHC: 28.5 g/dL — ABNORMAL LOW (ref 30.0–36.0)
MCV: 106.8 fL — ABNORMAL HIGH (ref 80.0–100.0)
Monocytes Absolute: 1.1 10*3/uL — ABNORMAL HIGH (ref 0.1–1.0)
Monocytes Relative: 8 %
Neutro Abs: 13.1 10*3/uL — ABNORMAL HIGH (ref 1.7–7.7)
Neutrophils Relative %: 90 %
Platelets: ADEQUATE 10*3/uL (ref 150–400)
RBC: 5.28 MIL/uL (ref 4.22–5.81)
RDW: 13.7 % (ref 11.5–15.5)
WBC: 14.6 10*3/uL — ABNORMAL HIGH (ref 4.0–10.5)
nRBC: 0 % (ref 0.0–0.2)

## 2020-09-22 LAB — BLOOD GAS, ARTERIAL
Acid-Base Excess: 3.6 mmol/L — ABNORMAL HIGH (ref 0.0–2.0)
Acid-Base Excess: 6 mmol/L — ABNORMAL HIGH (ref 0.0–2.0)
Bicarbonate: 24.3 mmol/L (ref 20.0–28.0)
Bicarbonate: 25 mmol/L (ref 20.0–28.0)
FIO2: 100
FIO2: 80
O2 Saturation: 97.9 %
O2 Saturation: 52.8 %
Patient temperature: 37.2
Patient temperature: 36.8
pCO2 arterial: 100 mmHg (ref 32.0–48.0)
pCO2 arterial: 95.2 mmHg (ref 32.0–48.0)
pH, Arterial: 7.135 — CL (ref 7.350–7.450)
pH, Arterial: 7.178 — CL (ref 7.350–7.450)
pO2, Arterial: 182 mmHg — ABNORMAL HIGH (ref 83.0–108.0)
pO2, Arterial: 35.5 mmHg — CL (ref 83.0–108.0)

## 2020-09-22 LAB — COMPREHENSIVE METABOLIC PANEL
ALT: 29 U/L (ref 0–44)
AST: 24 U/L (ref 15–41)
Albumin: 4.1 g/dL (ref 3.5–5.0)
Alkaline Phosphatase: 53 U/L (ref 38–126)
Anion gap: 9 (ref 5–15)
BUN: 20 mg/dL (ref 8–23)
CO2: 35 mmol/L — ABNORMAL HIGH (ref 22–32)
Calcium: 8.7 mg/dL — ABNORMAL LOW (ref 8.9–10.3)
Chloride: 95 mmol/L — ABNORMAL LOW (ref 98–111)
Creatinine, Ser: 1.05 mg/dL (ref 0.61–1.24)
GFR, Estimated: >60 mL/min (ref >60)
Glucose, Bld: 174 mg/dL — ABNORMAL HIGH (ref 70–99)
Potassium: 5 mmol/L (ref 3.5–5.1)
Sodium: 139 mmol/L (ref 135–145)
Total Bilirubin: 0.9 mg/dL (ref 0.3–1.2)
Total Protein: 7.2 g/dL (ref 6.5–8.1)

## 2020-09-22 LAB — GLUCOSE, CAPILLARY: Glucose-Capillary: 163 mg/dL — ABNORMAL HIGH (ref 70–99)

## 2020-09-22 LAB — POCT I-STAT 7, (LYTES, BLD GAS, ICA,H+H)
Acid-Base Excess: 6 mmol/L — ABNORMAL HIGH (ref 0.0–2.0)
Bicarbonate: 36.2 mmol/L — ABNORMAL HIGH (ref 20.0–28.0)
Calcium, Ion: 1.13 mmol/L — ABNORMAL LOW (ref 1.15–1.40)
HCT: 51 % (ref 39.0–52.0)
Hemoglobin: 17.3 g/dL — ABNORMAL HIGH (ref 13.0–17.0)
O2 Saturation: 90 %
Patient temperature: 37.2
Potassium: 4.3 mmol/L (ref 3.5–5.1)
Sodium: 139 mmol/L (ref 135–145)
TCO2: 39 mmol/L — ABNORMAL HIGH (ref 22–32)
pCO2 arterial: 79 mmHg (ref 32.0–48.0)
pH, Arterial: 7.271 — ABNORMAL LOW (ref 7.350–7.450)
pO2, Arterial: 72 mmHg — ABNORMAL LOW (ref 83.0–108.0)

## 2020-09-22 LAB — RESP PANEL BY RT-PCR (FLU A&B, COVID) ARPGX2
Influenza A by PCR: NEGATIVE
Influenza B by PCR: NEGATIVE
SARS Coronavirus 2 by RT PCR: NEGATIVE

## 2020-09-22 LAB — LACTIC ACID, PLASMA
Lactic Acid, Venous: 2.5 mmol/L (ref 0.5–1.9)
Lactic Acid, Venous: 1.4 mmol/L (ref 0.5–1.9)

## 2020-09-22 LAB — PROTIME-INR
INR: 1 (ref 0.8–1.2)
Prothrombin Time: 13.1 seconds (ref 11.4–15.2)

## 2020-09-22 LAB — APTT: aPTT: 21 seconds — ABNORMAL LOW (ref 24–36)

## 2020-09-22 LAB — BRAIN NATRIURETIC PEPTIDE: B Natriuretic Peptide: 376 pg/mL — ABNORMAL HIGH (ref 0.0–100.0)

## 2020-09-22 LAB — TROPONIN I (HIGH SENSITIVITY)
Troponin I (High Sensitivity): 59 ng/L — ABNORMAL HIGH (ref <18)
Troponin I (High Sensitivity): 66 ng/L — ABNORMAL HIGH (ref <18)

## 2020-09-22 MED ORDER — MIDAZOLAM HCL 5 MG/5ML IJ SOLN
INTRAMUSCULAR | Status: AC
  Administered 2020-09-22: 2.5 mg via INTRAVENOUS
  Administered 2020-09-22: 2.5 mg
  Filled 2020-09-22: qty 5

## 2020-09-22 MED ORDER — ORAL CARE MOUTH RINSE
15.0000 mL | OROMUCOSAL | Status: DC
  Administered 2020-09-22 – 2020-10-05 (×125): 15 mL via OROMUCOSAL

## 2020-09-22 MED ORDER — FENTANYL BOLUS VIA INFUSION
25.0000 ug | INTRAVENOUS | Status: DC | PRN
Start: 2020-09-22 — End: 2020-09-23
  Administered 2020-09-23: 75 ug via INTRAVENOUS
  Administered 2020-09-23: 50 ug via INTRAVENOUS
  Administered 2020-09-23: 75 ug via INTRAVENOUS
  Filled 2020-09-22: qty 100

## 2020-09-22 MED ORDER — POLYETHYLENE GLYCOL 3350 17 G PO PACK: 17.0000 g | PACK | Freq: Every day | ORAL | Status: DC | PRN

## 2020-09-22 MED ORDER — SODIUM CHLORIDE 0.9 % IV BOLUS
1000.0000 mL | Freq: Once | INTRAVENOUS | Status: AC
  Administered 2020-09-22: 1000 mL via INTRAVENOUS

## 2020-09-22 MED ORDER — PRAMIPEXOLE DIHYDROCHLORIDE 0.25 MG PO TABS: 0.2500 mg | ORAL_TABLET | Freq: Every evening | ORAL | Status: DC

## 2020-09-22 MED ORDER — PROPOFOL 1000 MG/100ML IV EMUL
5.0000 ug/kg/min | INTRAVENOUS | Status: DC
  Administered 2020-09-22: 40 ug/kg/min via INTRAVENOUS
  Administered 2020-09-22: 70 ug/kg/min via INTRAVENOUS
  Filled 2020-09-22: qty 100

## 2020-09-22 MED ORDER — PROPOFOL 1000 MG/100ML IV EMUL: 5.0000 ug/kg/min | INTRAVENOUS | Status: DC

## 2020-09-22 MED ORDER — ENOXAPARIN SODIUM 40 MG/0.4ML ~~LOC~~ SOLN
40.0000 mg | Freq: Every day | SUBCUTANEOUS | Status: DC
  Administered 2020-09-23 – 2020-10-04 (×13): 40 mg via SUBCUTANEOUS
  Filled 2020-09-22 (×13): qty 0.4

## 2020-09-22 MED ORDER — MIDAZOLAM HCL 2 MG/2ML IJ SOLN
1.0000 mg | INTRAMUSCULAR | Status: DC | PRN
  Administered 2020-09-24 – 2020-09-26 (×2): 1 mg via INTRAVENOUS
  Filled 2020-09-22: qty 2

## 2020-09-22 MED ORDER — FENTANYL 2500MCG IN NS 250ML (10MCG/ML) PREMIX INFUSION
25.0000 ug/h | INTRAVENOUS | Status: DC
  Administered 2020-09-22 – 2020-09-24 (×3): 25 ug/h via INTRAVENOUS
  Administered 2020-09-24 – 2020-09-25 (×2): 150 ug/h via INTRAVENOUS
  Administered 2020-09-26: 200 ug/h via INTRAVENOUS
  Administered 2020-09-26: 175 ug/h via INTRAVENOUS
  Administered 2020-09-27: 125 ug/h via INTRAVENOUS
  Administered 2020-09-28: 75 ug/h via INTRAVENOUS
  Administered 2020-09-29: 100 ug/h via INTRAVENOUS
  Administered 2020-09-30: 125 ug/h via INTRAVENOUS
  Filled 2020-09-22 (×11): qty 250

## 2020-09-22 MED ORDER — SODIUM CHLORIDE 0.9 % IV SOLN
2.0000 g | INTRAVENOUS | Status: AC
  Administered 2020-09-23 – 2020-09-26 (×4): 2 g via INTRAVENOUS
  Filled 2020-09-22 (×4): qty 20

## 2020-09-22 MED ORDER — MIDAZOLAM HCL 2 MG/2ML IJ SOLN
1.0000 mg | INTRAMUSCULAR | Status: AC | PRN
  Administered 2020-09-24 – 2020-09-25 (×3): 1 mg via INTRAVENOUS
  Filled 2020-09-22 (×5): qty 2

## 2020-09-22 MED ORDER — IPRATROPIUM BROMIDE 0.02 % IN SOLN
RESPIRATORY_TRACT | Status: AC
  Administered 2020-09-22: 0.5 mg
  Filled 2020-09-22: qty 2.5

## 2020-09-22 MED ORDER — NOREPINEPHRINE 4 MG/250ML-% IV SOLN
0.0000 ug/min | INTRAVENOUS | Status: DC
  Administered 2020-09-23: 4 ug/min via INTRAVENOUS
  Filled 2020-09-22: qty 250

## 2020-09-22 MED ORDER — SODIUM CHLORIDE 0.9 % IV SOLN
500.0000 mg | INTRAVENOUS | Status: AC
  Administered 2020-09-23 – 2020-09-26 (×4): 500 mg via INTRAVENOUS
  Filled 2020-09-22 (×4): qty 500

## 2020-09-22 MED ORDER — IPRATROPIUM-ALBUTEROL 0.5-2.5 (3) MG/3ML IN SOLN
3.0000 mL | Freq: Four times a day (QID) | RESPIRATORY_TRACT | Status: DC
  Administered 2020-09-23 – 2020-09-29 (×27): 3 mL via RESPIRATORY_TRACT
  Filled 2020-09-22 (×27): qty 3

## 2020-09-22 MED ORDER — FENTANYL CITRATE (PF) 100 MCG/2ML IJ SOLN
25.0000 ug | Freq: Once | INTRAMUSCULAR | Status: DC
  Filled 2020-09-22: qty 2

## 2020-09-22 MED ORDER — ASPIRIN 81 MG PO CHEW
81.0000 mg | CHEWABLE_TABLET | Freq: Every day | ORAL | Status: DC
  Administered 2020-09-23 – 2020-10-05 (×13): 81 mg via NASOGASTRIC
  Filled 2020-09-22 (×13): qty 1

## 2020-09-22 MED ORDER — ALBUTEROL SULFATE (2.5 MG/3ML) 0.083% IN NEBU
INHALATION_SOLUTION | RESPIRATORY_TRACT | Status: AC
  Administered 2020-09-22: 5 mg
  Filled 2020-09-22: qty 6

## 2020-09-22 MED ORDER — PROPOFOL 1000 MG/100ML IV EMUL
INTRAVENOUS | Status: AC
  Filled 2020-09-22: qty 100

## 2020-09-22 MED ORDER — PANTOPRAZOLE SODIUM 40 MG IV SOLR
40.0000 mg | Freq: Every day | INTRAVENOUS | Status: DC
  Administered 2020-09-23 (×2): 40 mg via INTRAVENOUS
  Filled 2020-09-22 (×2): qty 40

## 2020-09-22 MED ORDER — NOREPINEPHRINE 4 MG/250ML-% IV SOLN
INTRAVENOUS | Status: AC
  Administered 2020-09-22: 5 ug/min via INTRAVENOUS
  Filled 2020-09-22: qty 250

## 2020-09-22 MED ORDER — FUROSEMIDE 10 MG/ML IJ SOLN
40.0000 mg | Freq: Once | INTRAMUSCULAR | Status: AC
  Administered 2020-09-22: 40 mg via INTRAVENOUS

## 2020-09-22 MED ORDER — ATORVASTATIN CALCIUM 10 MG PO TABS
20.0000 mg | ORAL_TABLET | Freq: Every day | ORAL | Status: DC
  Administered 2020-09-23 – 2020-10-05 (×13): 20 mg via NASOGASTRIC
  Filled 2020-09-22 (×13): qty 2

## 2020-09-22 MED ORDER — METHYLPREDNISOLONE SODIUM SUCC 125 MG IJ SOLR
40.0000 mg | Freq: Two times a day (BID) | INTRAMUSCULAR | Status: DC
  Administered 2020-09-23 (×3): 40 mg via INTRAVENOUS
  Filled 2020-09-22 (×3): qty 2

## 2020-09-22 MED ORDER — POLYETHYLENE GLYCOL 3350 17 G PO PACK
17.0000 g | PACK | Freq: Every day | ORAL | Status: DC
  Administered 2020-09-23 – 2020-10-05 (×13): 17 g
  Filled 2020-09-22 (×12): qty 1

## 2020-09-22 MED ORDER — INSULIN ASPART 100 UNIT/ML ~~LOC~~ SOLN
0.0000 [IU] | SUBCUTANEOUS | Status: DC
  Administered 2020-09-23 (×2): 3 [IU] via SUBCUTANEOUS
  Administered 2020-09-23: 2 [IU] via SUBCUTANEOUS
  Administered 2020-09-23: 3 [IU] via SUBCUTANEOUS
  Administered 2020-09-24 – 2020-09-25 (×5): 2 [IU] via SUBCUTANEOUS
  Administered 2020-09-26: 3 [IU] via SUBCUTANEOUS
  Administered 2020-09-26 – 2020-09-27 (×4): 2 [IU] via SUBCUTANEOUS
  Administered 2020-09-27 (×2): 3 [IU] via SUBCUTANEOUS
  Administered 2020-09-28 (×2): 2 [IU] via SUBCUTANEOUS

## 2020-09-22 MED ORDER — DOCUSATE SODIUM 50 MG/5ML PO LIQD
100.0000 mg | Freq: Two times a day (BID) | ORAL | Status: DC
  Administered 2020-09-23 – 2020-10-05 (×25): 100 mg
  Filled 2020-09-22 (×23): qty 10

## 2020-09-22 MED ORDER — METHYLPREDNISOLONE SODIUM SUCC 125 MG IJ SOLR
125.0000 mg | Freq: Once | INTRAMUSCULAR | Status: AC
  Administered 2020-09-22: 125 mg via INTRAVENOUS
  Filled 2020-09-22: qty 2

## 2020-09-22 MED ORDER — SODIUM CHLORIDE 0.9 % IV SOLN
1.0000 g | Freq: Once | INTRAVENOUS | Status: AC
  Administered 2020-09-22: 1 g via INTRAVENOUS
  Filled 2020-09-22: qty 10

## 2020-09-22 MED ORDER — SODIUM CHLORIDE 0.9 % IV SOLN
500.0000 mg | Freq: Once | INTRAVENOUS | Status: AC
  Administered 2020-09-22: 500 mg via INTRAVENOUS
  Filled 2020-09-22: qty 500

## 2020-09-22 MED ORDER — DOCUSATE SODIUM 100 MG PO CAPS: 100.0000 mg | ORAL_CAPSULE | Freq: Two times a day (BID) | ORAL | Status: DC | PRN

## 2020-09-22 MED ORDER — SODIUM CHLORIDE 0.9 % IV BOLUS
2000.0000 mL | Freq: Once | INTRAVENOUS | Status: AC
  Administered 2020-09-22: 2000 mL via INTRAVENOUS

## 2020-09-22 MED ORDER — CHLORHEXIDINE GLUCONATE 0.12% ORAL RINSE (MEDLINE KIT)
15.0000 mL | Freq: Two times a day (BID) | OROMUCOSAL | Status: DC
  Administered 2020-09-22 – 2020-10-05 (×26): 15 mL via OROMUCOSAL

## 2020-09-22 MED ORDER — CHLORHEXIDINE GLUCONATE CLOTH 2 % EX PADS
6.0000 | MEDICATED_PAD | Freq: Every day | CUTANEOUS | Status: DC
  Administered 2020-09-22 – 2020-10-04 (×12): 6 via TOPICAL

## 2020-09-22 MED ORDER — MAGNESIUM SULFATE 2 GM/50ML IV SOLN
2.0000 g | Freq: Once | INTRAVENOUS | Status: AC
  Administered 2020-09-22: 2 g via INTRAVENOUS
  Filled 2020-09-22: qty 50

## 2020-09-22 MED ORDER — PROPOFOL 1000 MG/100ML IV EMUL
0.0000 ug/kg/min | INTRAVENOUS | Status: DC
  Administered 2020-09-22 – 2020-09-23 (×2): 20 ug/kg/min via INTRAVENOUS
  Filled 2020-09-22 (×2): qty 100

## 2020-09-22 NOTE — ED Notes (Signed)
Patient wallet inventoried with security. Patient cell phone and wallet placed into safe by security.

## 2020-09-22 NOTE — Progress Notes (Signed)
ABG collected and results called in to [facility].

## 2020-09-22 NOTE — ED Notes (Signed)
Succinate 120 mg given IV

## 2020-09-22 NOTE — ED Notes (Signed)
Etomidate 20 mg given IV

## 2020-09-22 NOTE — ED Notes (Signed)
Unable to obtain MSE at this time, pt altered. Unable to obtain temp at this time.

## 2020-09-22 NOTE — ED Triage Notes (Signed)
Pt brought to ED via [facility], pt with AMS. EMS called out for respiratory distress. O2 sat 71% on 2L. Pt in distress, EDP to bedside.

## 2020-09-22 NOTE — ED Notes (Signed)
Pt intubated with 7.5 ET tube x 1 attempt by Dr Roderic Palau without difficulty, 23 at lip.

## 2020-09-22 NOTE — ED Notes (Signed)
[facility] here to transport pt.

## 2020-09-22 NOTE — Progress Notes (Signed)
[facility]Link Physician-Brief Progress Note Patient Name: Blong Busk DOB: 12/20/1955 MRN: [id number]   Date of Service  09/13/2020  HPI/Events of Note  Patient admitted with right lobar pneumonia, and acute hypoxemic /hypercapnic respiratory failure s/p intubation,  He is  currently on the ventilator.  eICU Interventions  New Patient Evaluation completed.        Kerry Kass Tomie Spizzirri 09/20/2020, 11:20 PM

## 2020-09-22 NOTE — ED Notes (Addendum)
Critical Results: ABG PH 7.178 PCO2 95.2 PO2 35.5 Reported to Dr. Roderic Palau.

## 2020-09-22 NOTE — ED Notes (Signed)
Nasal trumpet placed in right nostril.

## 2020-09-22 NOTE — ED Notes (Signed)
PPV started per this RN.

## 2020-09-22 NOTE — H&P (Signed)
Paul Branch is an 65 y.o. male.   Chief Complaint: shortness of breath HPI: Paul Branch is a 65 year old gentleman with PMH significant for COPD on home Oxygen of 2 liters Paul Branch, nicotine dependency, hypertension and hyperlipidemia.  Patient presented to [facility] via EMS with worsening dysnoea.  He was found to be hypoxic with saturations of 70% on 2 liters nasal cannula Oxygen. Patient's wife reported that patient had become more short of breath. His mental status became impaired so EMS was called. The patient was initially placed on 100% FIO2 but soon afterward intubated because of hypercarbia and hypoxia. His COVID 19 and Influenza panel were negative. It is reported that patient received diuretic therapy in the ED which cause 1800 ml diuresis followed by hypotension.  Hence he was given IVF and required Levophed post intubation.  CXR is consistent with right lobar pneumonia.  I have seen and examined the patient.  He arrived intubated and sedated on Propofol.  Past Medical History:  Diagnosis Date  . COPD (chronic obstructive pulmonary disease) ([place])   . DDD (degenerative disc disease), thoracolumbar 01/25/2013  . Depression   . Disease characterized by destruction of skeletal muscle 02/12/2019  . Hyperlipidemia   . Hypertension   . Stroke ([place])   . Thyroid enlarged 02/19/2019    Past Surgical History:  Procedure Laterality Date  . APPENDECTOMY    . ELECTROMAGNETIC NAVIGATION BROCHOSCOPY Left 02/10/2019   Procedure: ELECTROMAGNETIC NAVIGATION BRONCHOSCOPY, LEFT;  Surgeon: Tyler Pita, MD;  Location: [facility] ORS;  Service: Cardiopulmonary;  Laterality: Left;  . pinched nerve      neck    Family History  Problem Relation Age of Onset  . Stroke Mother    Social History:  reports that he has been smoking cigarettes. He has a 11.50 pack-year smoking history. He has never used smokeless tobacco. He reports that he does not drink alcohol and does not use  drugs.  Allergies: No Known Allergies  Medications Prior to Admission  Medication Sig Dispense Refill  . amLODipine (NORVASC) 2.5 MG tablet Take by mouth.    . budesonide (PULMICORT) 0.5 MG/2ML nebulizer solution Inhale into the lungs.    . metoprolol succinate (TOPROL-XL) 25 MG 24 hr tablet Take 1 tablet by mouth daily.    . predniSONE (DELTASONE) 10 MG tablet Take 4 tabs on days 1-3, then 3 tabs on days 4-6, then 2 tabs on days 7-9 then 1 tab on day 10 then off    . tadalafil (CIALIS) 5 MG tablet Take by mouth.    Marland Kitchen albuterol (PROVENTIL HFA;VENTOLIN HFA) 108 (90 Base) MCG/ACT inhaler     . albuterol (PROVENTIL) (2.5 MG/3ML) 0.083% nebulizer solution Take 3 mLs by nebulization in the morning, at noon, in the evening, and at bedtime.    Jearl Klinefelter ELLIPTA 62.5-25 MCG/INH AEPB Inhale 1 puff into the lungs daily.    Marland Kitchen aspirin EC 81 MG tablet Take 81 mg by mouth daily.     Marland Kitchen atorvastatin (LIPITOR) 20 MG tablet Take 20 mg by mouth daily.     . famotidine (PEPCID) 20 MG tablet Take 1 tablet (20 mg total) by mouth daily. 30 tablet 0  . Fluticasone-Umeclidin-Vilant (TRELEGY ELLIPTA) 100-62.5-25 MCG/INH AEPB Inhale 1 puff into the lungs daily. (Patient not taking: Reported on 04/07/2020) 1 each 6  . melatonin 5 MG TABS Take 5 mg by mouth at bedtime as needed.    . pramipexole (MIRAPEX) 0.25 MG tablet Take 0.25  mg by mouth every evening.     . varenicline (CHANTIX) 1 MG tablet Take by mouth.      Results for orders placed or performed during the hospital encounter of 10/04/2020 (from the past 48 hour(s))  Urinalysis, Routine w reflex microscopic Urine, Catheterized     Status: Abnormal   Collection Time: 09/11/2020  6:40 PM  Result Value Ref Range   Color, Urine YELLOW YELLOW   APPearance CLEAR CLEAR   Specific Gravity, Urine 1.023 1.005 - 1.030   pH 5.0 5.0 - 8.0   Glucose, UA NEGATIVE NEGATIVE mg/dL   Hgb urine dipstick MODERATE (A) NEGATIVE   Bilirubin Urine NEGATIVE NEGATIVE   Ketones, ur  NEGATIVE NEGATIVE mg/dL   Protein, ur >=300 (A) NEGATIVE mg/dL   Nitrite NEGATIVE NEGATIVE   Leukocytes,Ua NEGATIVE NEGATIVE   RBC / HPF 0-5 0 - 5 RBC/hpf   WBC, UA 0-5 0 - 5 WBC/hpf   Bacteria, UA NONE SEEN NONE SEEN   Squamous Epithelial / LPF 6-10 0 - 5   Mucus PRESENT     Comment: Performed at [facility], [address], [place], [place] [address]  Lactic acid, plasma     Status: Abnormal   Collection Time: 09/10/2020  7:04 PM  Result Value Ref Range   Lactic Acid, Venous 2.5 (HH) 0.5 - 1.9 mmol/L    Comment: CRITICAL RESULT CALLED TO, READ BACK BY AND VERIFIED WITH: Sharyon Cable 2006 09/13/2020 COLEMAN,R Performed at [facility], [address], [place], [place] [address]   Comprehensive metabolic panel     Status: Abnormal   Collection Time: 09/13/2020  7:04 PM  Result Value Ref Range   Sodium 139 135 - 145 mmol/L   Potassium 5.0 3.5 - 5.1 mmol/L   Chloride 95 (L) 98 - 111 mmol/L   CO2 35 (H) 22 - 32 mmol/L   Glucose, Bld 174 (H) 70 - 99 mg/dL    Comment: Glucose reference range applies only to samples taken after fasting for at least 8 hours.   BUN 20 8 - 23 mg/dL   Creatinine, Ser 1.05 0.61 - 1.24 mg/dL   Calcium 8.7 (L) 8.9 - 10.3 mg/dL   Total Protein 7.2 6.5 - 8.1 g/dL   Albumin 4.1 3.5 - 5.0 g/dL   AST 24 15 - 41 U/L   ALT 29 0 - 44 U/L   Alkaline Phosphatase 53 38 - 126 U/L   Total Bilirubin 0.9 0.3 - 1.2 mg/dL   GFR, Estimated >60 >60 mL/min    Comment: (NOTE) Calculated using the CKD-EPI Creatinine Equation (2021)    Anion gap 9 5 - 15    Comment: Performed at [facility], [address], [place], [place] [address]  CBC WITH DIFFERENTIAL     Status: Abnormal   Collection Time: 09/12/2020  7:04 PM  Result Value Ref Range   WBC 14.6 (H) 4.0 - 10.5 K/uL   RBC 5.28 4.22 - 5.81 MIL/uL   Hemoglobin 16.1 13.0 - 17.0 g/dL   HCT 56.4 (H) 39.0 - 52.0 %   MCV 106.8 (H) 80.0 - 100.0 fL   MCH 30.5 26.0 - 34.0 pg   MCHC 28.5 (L) 30.0 - 36.0 g/dL   RDW 13.7 11.5 - 15.5 %    Platelets  150 - 400 K/uL    PLATELET CLUMPS NOTED ON SMEAR, COUNT APPEARS ADEQUATE   nRBC 0.0 0.0 - 0.2 %   Neutrophils Relative % 90 %   Neutro Abs 13.1 (H)  1.7 - 7.7 K/uL   Lymphocytes Relative 1 %   Lymphs Abs 0.2 (L) 0.7 - 4.0 K/uL   Monocytes Relative 8 %   Monocytes Absolute 1.1 (H) 0.1 - 1.0 K/uL   Eosinophils Relative 0 %   Eosinophils Absolute 0.0 0.0 - 0.5 K/uL   Basophils Relative 0 %   Basophils Absolute 0.0 0.0 - 0.1 K/uL   Immature Granulocytes 1 %   Abs Immature Granulocytes 0.12 (H) 0.00 - 0.07 K/uL    Comment: Performed at [facility], [address], [place], [place] [address]  Protime-INR     Status: None   Collection Time: 09/25/2020  7:04 PM  Result Value Ref Range   Prothrombin Time 13.1 11.4 - 15.2 seconds   INR 1.0 0.8 - 1.2    Comment: (NOTE) INR goal varies based on device and disease states. Performed at [facility], [address], [place], [place] [address]   APTT     Status: Abnormal   Collection Time: 09/17/2020  7:04 PM  Result Value Ref Range   aPTT 21 (L) 24 - 36 seconds    Comment: Performed at [facility], [address], [place], [place] [address]  Troponin I (High Sensitivity)     Status: Abnormal   Collection Time: 09/20/2020  7:04 PM  Result Value Ref Range   Troponin I (High Sensitivity) 59 (H) <18 ng/L    Comment: (NOTE) Elevated high sensitivity troponin I (hsTnI) values and significant  changes across serial measurements may suggest ACS but many other  chronic and acute conditions are known to elevate hsTnI results.  Refer to the "Links" section for chest pain algorithms and additional  guidance. Performed at [facility], [address] [place], [place] [address]   Blood culture (routine single)     Status: None (Preliminary result)   Collection Time: 10/01/2020  7:05 PM   Specimen: Neck; Blood  Result Value Ref Range   Specimen Description NECK RIGHT JUGULAR    Special Requests      Blood Culture adequate volume BOTTLES DRAWN  AEROBIC ONLY Performed at [facility], [address], [place], [place] [address]    Culture PENDING    Report Status PENDING   Brain natriuretic peptide     Status: Abnormal   Collection Time: 09/12/2020  7:05 PM  Result Value Ref Range   B Natriuretic Peptide 376.0 (H) 0.0 - 100.0 pg/mL    Comment: Performed at [facility], [address] [place], [place] [address]  Resp Panel by RT-PCR (Flu A&B, Covid) Nasopharyngeal Swab     Status: None   Collection Time: 09/27/2020  7:32 PM   Specimen: Nasopharyngeal Swab; Nasopharyngeal(NP) swabs in vial transport medium  Result Value Ref Range   SARS Coronavirus 2 by RT PCR NEGATIVE NEGATIVE    Comment: (NOTE) SARS-CoV-2 target nucleic acids are NOT DETECTED.  The SARS-CoV-2 RNA is generally detectable in upper respiratory specimens during the acute phase of infection. The lowest concentration of SARS-CoV-2 viral copies this assay can detect is 138 copies/mL. A negative result does not preclude SARS-Cov-2 infection and should not be used as the sole basis for treatment or other patient management decisions. A negative result may occur with  improper specimen collection/handling, submission of specimen other than nasopharyngeal swab, presence of viral mutation(s) within the areas targeted by this assay, and inadequate number of viral copies(<138 copies/mL). A negative result must be combined with clinical observations, patient history, and epidemiological information. The expected result  is Negative.  Fact Sheet for Patients:  EntrepreneurPulse.com.au  Fact Sheet for Healthcare Providers:  IncredibleEmployment.be  This test is no t yet approved or cleared by the [place] FDA and  has been authorized for detection and/or diagnosis of SARS-CoV-2 by FDA under an Emergency Use Authorization (EUA). This EUA will remain  in effect (meaning this test can be used) for the duration of the COVID-19  declaration under Section 564(b)(1) of the Act, 21 U.S.C.section 360bbb-3(b)(1), unless the authorization is terminated  or revoked sooner.       Influenza A by PCR NEGATIVE NEGATIVE   Influenza B by PCR NEGATIVE NEGATIVE    Comment: (NOTE) The Xpert Xpress SARS-CoV-2/FLU/RSV plus assay is intended as an aid in the diagnosis of influenza from Nasopharyngeal swab specimens and should not be used as a sole basis for treatment. Nasal washings and aspirates are unacceptable for Xpert Xpress SARS-CoV-2/FLU/RSV testing.  Fact Sheet for Patients: EntrepreneurPulse.com.au  Fact Sheet for Healthcare Providers: IncredibleEmployment.be  This test is not yet approved or cleared by the [place] FDA and has been authorized for detection and/or diagnosis of SARS-CoV-2 by FDA under an Emergency Use Authorization (EUA). This EUA will remain in effect (meaning this test can be used) for the duration of the COVID-19 declaration under Section 564(b)(1) of the Act, 21 U.S.C. section 360bbb-3(b)(1), unless the authorization is terminated or revoked.  Performed at [facility], [address], [place], [place] [address]   Blood gas, arterial     Status: Abnormal   Collection Time: 09/16/2020  8:00 PM  Result Value Ref Range   FIO2 100.00    pH, Arterial 7.135 (LL) 7.350 - 7.450    Comment: CRITICAL RESULT CALLED TO, READ BACK BY AND VERIFIED WITH: SPENCE.H @ 2012 ON 09/21/2020 BY JUW    pCO2 arterial 100 (HH) 32.0 - 48.0 mmHg    Comment: CRITICAL RESULT CALLED TO, READ BACK BY AND VERIFIED WITH: SPENCE,H @ 2012 ON 09/28/2020 BY JUW    pO2, Arterial 182 (H) 83.0 - 108.0 mmHg   Bicarbonate 24.3 20.0 - 28.0 mmol/L   Acid-Base Excess 3.6 (H) 0.0 - 2.0 mmol/L   O2 Saturation 97.9 %   Patient temperature 37.2    Allens test (pass/fail) NOT INDICATED (A) PASS    Comment: Performed at [facility], [address], [place], [place] [address]  Lactic acid, plasma      Status: None   Collection Time: 09/29/2020  9:28 PM  Result Value Ref Range   Lactic Acid, Venous 1.4 0.5 - 1.9 mmol/L    Comment: Performed at [facility], [address], [place], [place] [address]  Troponin I (High Sensitivity)     Status: Abnormal   Collection Time: 09/24/2020  9:28 PM  Result Value Ref Range   Troponin I (High Sensitivity) 66 (H) <18 ng/L    Comment: (NOTE) Elevated high sensitivity troponin I (hsTnI) values and significant  changes across serial measurements may suggest ACS but many other  chronic and acute conditions are known to elevate hsTnI results.  Refer to the "Links" section for chest pain algorithms and additional  guidance. Performed at [facility], [address], [place],  [address]   Blood gas, arterial     Status: Abnormal   Collection Time: 09/23/2020  9:45 PM  Result Value Ref Range   FIO2 80.00    pH, Arterial 7.178 (LL) 7.350 - 7.450    Comment: CRITICAL RESULT CALLED TO, READ BACK BY AND  VERIFIED WITH: SPENTE,H 2153 09/17/2020 COLEMAN,R    pCO2 arterial 95.2 (HH) 32.0 - 48.0 mmHg    Comment: CRITICAL RESULT CALLED TO, READ BACK BY AND VERIFIED WITH: SPENTE,H 2153 09/21/2020 COLEMAN,R    pO2, Arterial 35.5 (LL) 83.0 - 108.0 mmHg    Comment: CRITICAL RESULT CALLED TO, READ BACK BY AND VERIFIED WITH: [facility] 2153 09/23/2020 COLEMAN,R    Bicarbonate 25.0 20.0 - 28.0 mmol/L   Acid-Base Excess 6.0 (H) 0.0 - 2.0 mmol/L   O2 Saturation 52.8 %   Patient temperature 36.8    Allens test (pass/fail) PASS PASS    Comment: Performed at [facility], [address], [place], [place] [address]  Glucose, capillary     Status: Abnormal   Collection Time: 09/25/2020 11:28 PM  Result Value Ref Range   Glucose-Capillary 163 (H) 70 - 99 mg/dL    Comment: Glucose reference range applies only to samples taken after fasting for at least 8 hours.  I-STAT 7, (LYTES, BLD GAS, ICA, H+H)     Status: Abnormal   Collection Time: 09/12/2020 11:30 PM  Result Value  Ref Range   pH, Arterial 7.271 (L) 7.350 - 7.450   pCO2 arterial 79.0 (HH) 32.0 - 48.0 mmHg   pO2, Arterial 72 (L) 83.0 - 108.0 mmHg   Bicarbonate 36.2 (H) 20.0 - 28.0 mmol/L   TCO2 39 (H) 22 - 32 mmol/L   O2 Saturation 90.0 %   Acid-Base Excess 6.0 (H) 0.0 - 2.0 mmol/L   Sodium 139 135 - 145 mmol/L   Potassium 4.3 3.5 - 5.1 mmol/L   Calcium, Ion 1.13 (L) 1.15 - 1.40 mmol/L   HCT 51.0 39.0 - 52.0 %   Hemoglobin 17.3 (H) 13.0 - 17.0 g/dL   Patient temperature 37.2 C    Collection site Radial    Drawn by HIDE    Sample type ARTERIAL    Comment NOTIFIED PHYSICIAN    DG Chest Port 1 View  Result Date: 09/20/2020 CLINICAL DATA:  Sepsis, respiratory failure EXAM: PORTABLE CHEST 1 VIEW COMPARISON:  04/07/2020 FINDINGS: Endotracheal tube seen 5.9 cm above the carina. Nasogastric tube tip is just beyond the gastroesophageal junction with its proximal side hole position within the distal esophagus. The lungs are hyperinflated in keeping with changes of underlying COPD. There is superimposed right basilar consolidation in keeping with acute lobar pneumonia. No pneumothorax or pleural effusion. Cardiac size within normal limits. No acute bone abnormality. IMPRESSION: Endotracheal tube in appropriate position. Nasogastric tube advancement by 10-15 cm is recommended for optimal positioning. Right basilar consolidation in keeping with acute lobar pneumonia. COPD. Electronically Signed   By: Fidela Salisbury MD   On: 09/07/2020 19:14    Review of Systems  Unable to perform ROS: Intubated    Blood pressure 90/65, pulse 74, temperature 98.96 F (37.2 C), resp. rate (!) 24, height 5\' 11"  (1.803 m), weight 91.2 kg, SpO2 100 %. Physical Exam Vitals reviewed.  Constitutional:      Appearance: He is obese. He is ill-appearing.     Comments: Comfortable on vent  HENT:     Head: Normocephalic and atraumatic.     Nose: Nose normal.     Mouth/Throat:     Comments: ET tube in place Eyes:     Pupils:  Pupils are equal, round, and reactive to light.  Cardiovascular:     Rate and Rhythm: Normal rate and regular rhythm.     Pulses: Normal pulses.     Heart sounds:  Normal heart sounds. No friction rub.  Pulmonary:     Breath sounds: No wheezing or rhonchi.     Comments: VENT: PRVC, RR 24, TV 420, PEEP 8, FIo2 80% Abdominal:     Palpations: Abdomen is soft.     Comments: OGT in place  Musculoskeletal:     Cervical back: Neck supple.     Right lower leg: No edema.     Left lower leg: No edema.  Skin:    General: Skin is warm and dry.  Neurological:     Comments: Sedated RAS -3      Assessment/Plan 1. Acuter on chronic hypoxic and hypercarbic respiratory failure secondary to community acquired right lobar pneumonia (CAP)  and acute exacerbation of COPD. COVID negative, influenza negative. Plan: check ET tube for placement as patient is new transfer, lung protective vent protocol. Rep culture, ABX with rocephin and Azithromycin, check legionella and Strep ag, MRSA screen, Solumedrol and Duoneb for COPD exacerbation.  2. Septic shock secondary to CAP Plan: wean vasopressors as able, trend procalcitonin, continue ABX  3. Demand ischemia Plan: restart ASA  4. Hx hypertension Plan: home meds on hold because of septic shock.  5. Hx nicotine dependency Plan: trying to quit.  6. Hx of hyperlipidemia Plan: continue Lipitor.  Drips: Levophed, Propofol, Fentanyl Lines: PIVs Prophylaxis: Protonix, Lovenox Foley: yes Nutrition: start per RD Code: FULL  Family: wife Gaje Tennyson has been updated.  Thank you for allowing me the privilege to care for this patient.  I have dedicated a total of 75 minutes in critical care time minus all appropriate exclusions.   4. Hx of hyperlipidemia Plan: continue Lipitor.  Lafayette Dragon, MD 09/28/2020, 11:56 PM

## 2020-09-22 NOTE — ED Provider Notes (Signed)
[facility] EMERGENCY DEPARTMENT Provider Note   CSN: [id number] Arrival date & time: 10/03/2020  1836     History Chief Complaint  Patient presents with  . Respiratory Distress    Paul Branch is a 65 y.o. male.  Patient has a history of COPD.  He presented to the emergency department extremely short of breath and obtunded  The history is provided by the EMS personnel. No language interpreter was used.  Shortness of Breath Severity:  Severe Onset quality:  Sudden Duration: Unknown. Timing:  Constant Progression:  Worsening Chronicity: Unknown. Context: activity   Relieved by:  Nothing Worsened by:  Nothing Ineffective treatments:  Oxygen Associated symptoms: fever        Past Medical History:  Diagnosis Date  . COPD (chronic obstructive pulmonary disease) ([place])   . DDD (degenerative disc disease), thoracolumbar 01/25/2013  . Depression   . Disease characterized by destruction of skeletal muscle 02/12/2019  . Hyperlipidemia   . Hypertension   . Stroke ([place])   . Thyroid enlarged 02/19/2019    Patient Active Problem List   Diagnosis Date Noted  . CAP (community acquired pneumonia) 09/13/2020  . Nicotine dependence 09/12/2020  . Housing or economic circumstance 09/12/2020  . Chronic respiratory failure ([place])   . Adrenal adenoma 02/19/2019  . Restless legs 02/19/2019  . Thyroid enlarged 02/19/2019  . Disease characterized by destruction of skeletal muscle 02/12/2019  . Elevated CO2 level 02/10/2019  . Acute on chronic respiratory failure ([place]) 02/10/2019  . Left upper lobe pulmonary nodule 06/15/2018  . Aortic atherosclerosis ([place]) 06/15/2018  . Personal history of tobacco use, presenting hazards to health 06/07/2018  . Major depressive disorder, recurrent, in partial remission ([place]) 05/06/2017  . Diastolic dysfunction 08/67/6195  . Dyslipidemia 04/17/2014  . DDD (degenerative disc disease), thoracolumbar 01/25/2013  . Pulmonary emphysema ([place]) 02/24/2011   . Hypertension 02/24/2011  . Depression 02/24/2011  . History of TIAs 02/24/2011    Past Surgical History:  Procedure Laterality Date  . APPENDECTOMY    . ELECTROMAGNETIC NAVIGATION BROCHOSCOPY Left 02/10/2019   Procedure: ELECTROMAGNETIC NAVIGATION BRONCHOSCOPY, LEFT;  Surgeon: Tyler Pita, MD;  Location: [facility] ORS;  Service: Cardiopulmonary;  Laterality: Left;  . pinched nerve      neck       Family History  Problem Relation Age of Onset  . Stroke Mother     Social History   Tobacco Use  . Smoking status: Current Every Day Smoker    Packs/day: 0.25    Years: 46.00    Pack years: 11.50    Types: Cigarettes  . Smokeless tobacco: Never Used  . Tobacco comment: wearing patches, using Nicorette, smokes 3-4 cigs. per day  Vaping Use  . Vaping Use: Never used  Substance Use Topics  . Alcohol use: Never  . Drug use: Never    Home Medications Prior to Admission medications   Medication Sig Start Date End Date Taking? Authorizing Provider  amLODipine (NORVASC) 2.5 MG tablet Take by mouth. 09/18/20 09/18/21 Yes [provider]  budesonide (PULMICORT) 0.5 MG/2ML nebulizer solution Inhale into the lungs. 09/18/20 09/18/21 Yes [provider]  metoprolol succinate (TOPROL-XL) 25 MG 24 hr tablet Take 1 tablet by mouth daily. 04/11/20 04/11/21 Yes [provider]  predniSONE (DELTASONE) 10 MG tablet Take 4 tabs on days 1-3, then 3 tabs on days 4-6, then 2 tabs on days 7-9 then 1 tab on day 10 then off 09/18/20  Yes [provider]  tadalafil (CIALIS)  5 MG tablet Take by mouth. 04/12/20 04/12/21 Yes [provider]  albuterol (PROVENTIL HFA;VENTOLIN HFA) 108 (90 Base) MCG/ACT inhaler  06/21/18   [provider]  albuterol (PROVENTIL) (2.5 MG/3ML) 0.083% nebulizer solution Take 3 mLs by nebulization in the morning, at noon, in the evening, and at bedtime. 03/26/20   [provider]  ANORO ELLIPTA 62.5-25 MCG/INH AEPB Inhale 1  puff into the lungs daily. 03/26/20   [provider]  aspirin EC 81 MG tablet Take 81 mg by mouth daily.  10/23/10   [provider]  atorvastatin (LIPITOR) 20 MG tablet Take 20 mg by mouth daily.  11/03/17 04/07/20  [provider]  famotidine (PEPCID) 20 MG tablet Take 1 tablet (20 mg total) by mouth daily. 04/09/20 05/09/20  Nolberto Hanlon, MD  Fluticasone-Umeclidin-Vilant (TRELEGY ELLIPTA) 100-62.5-25 MCG/INH AEPB Inhale 1 puff into the lungs daily. Patient not taking: Reported on 04/07/2020 02/02/19   Tyler Pita, MD  melatonin 5 MG TABS Take 5 mg by mouth at bedtime as needed.    [provider]  pramipexole (MIRAPEX) 0.25 MG tablet Take 0.25 mg by mouth every evening.  10/04/17   [provider]  varenicline (CHANTIX) 1 MG tablet Take by mouth. 09/19/20   [provider]    Allergies    Patient has no known allergies.  Review of Systems   Review of Systems  Unable to perform ROS: Acuity of condition  Constitutional: Positive for fever.  Respiratory: Positive for shortness of breath.     Physical Exam Updated Vital Signs BP 107/65   Pulse 86   Temp 98.8 F (37.1 C)   Resp 15   Ht 5\' 11"  (1.803 m)   Wt 88.9 kg   SpO2 100%   BMI 27.34 kg/m   Physical Exam Vitals and nursing note reviewed.  Constitutional:      General: He is in acute distress.     Appearance: He is well-developed. He is toxic-appearing.     Comments: Lethargic  HENT:     Head: Normocephalic.     Nose: Nose normal.     Mouth/Throat:     Mouth: Mucous membranes are moist.  Eyes:     General: No scleral icterus.    Conjunctiva/sclera: Conjunctivae normal.  Neck:     Thyroid: No thyromegaly.  Cardiovascular:     Rate and Rhythm: Regular rhythm.     Heart sounds: No murmur heard. No friction rub. No gallop.      Comments: Tachycardic Pulmonary:     Effort: Respiratory distress present.     Breath sounds: No stridor. Wheezing and rales  present.  Chest:     Chest wall: No tenderness.  Abdominal:     General: There is no distension.     Tenderness: There is no abdominal tenderness. There is no rebound.  Musculoskeletal:        General: Normal range of motion.     Cervical back: Neck supple.  Lymphadenopathy:     Cervical: No cervical adenopathy.  Skin:    Findings: No erythema or rash.     Comments: Diaphoretic  Neurological:     Motor: No abnormal muscle tone.     Coordination: Coordination normal.     Comments: Very lethargic.  Not responding to verbal stimuli or painful     ED Results / Procedures / Treatments   Labs (all labs ordered are listed, but only abnormal results are displayed) Labs Reviewed  LACTIC ACID,  PLASMA - Abnormal; Notable for the following components:      Result Value   Lactic Acid, Venous 2.5 (*)    All other components within normal limits  COMPREHENSIVE METABOLIC PANEL - Abnormal; Notable for the following components:   Chloride 95 (*)    CO2 35 (*)    Glucose, Bld 174 (*)    Calcium 8.7 (*)    All other components within normal limits  CBC WITH DIFFERENTIAL/PLATELET - Abnormal; Notable for the following components:   WBC 14.6 (*)    HCT 56.4 (*)    MCV 106.8 (*)    MCHC 28.5 (*)    Neutro Abs 13.1 (*)    Lymphs Abs 0.2 (*)    Monocytes Absolute 1.1 (*)    Abs Immature Granulocytes 0.12 (*)    All other components within normal limits  APTT - Abnormal; Notable for the following components:   aPTT 21 (*)    All other components within normal limits  URINALYSIS, ROUTINE W REFLEX MICROSCOPIC - Abnormal; Notable for the following components:   Hgb urine dipstick MODERATE (*)    Protein, ur >=300 (*)    All other components within normal limits  BRAIN NATRIURETIC PEPTIDE - Abnormal; Notable for the following components:   B Natriuretic Peptide 376.0 (*)    All other components within normal limits  BLOOD GAS, ARTERIAL - Abnormal; Notable for the following components:   pH,  Arterial 7.135 (*)    pCO2 arterial 100 (*)    pO2, Arterial 182 (*)    Acid-Base Excess 3.6 (*)    Allens test (pass/fail) NOT INDICATED (*)    All other components within normal limits  TROPONIN I (HIGH SENSITIVITY) - Abnormal; Notable for the following components:   Troponin I (High Sensitivity) 59 (*)    All other components within normal limits  CULTURE, BLOOD (SINGLE)  RESP PANEL BY RT-PCR (FLU A&B, COVID) ARPGX2  URINE CULTURE  PROTIME-INR  LACTIC ACID, PLASMA  TROPONIN I (HIGH SENSITIVITY)    EKG None  Radiology DG Chest Port 1 View  Result Date: 09/07/2020 CLINICAL DATA:  Sepsis, respiratory failure EXAM: PORTABLE CHEST 1 VIEW COMPARISON:  04/07/2020 FINDINGS: Endotracheal tube seen 5.9 cm above the carina. Nasogastric tube tip is just beyond the gastroesophageal junction with its proximal side hole position within the distal esophagus. The lungs are hyperinflated in keeping with changes of underlying COPD. There is superimposed right basilar consolidation in keeping with acute lobar pneumonia. No pneumothorax or pleural effusion. Cardiac size within normal limits. No acute bone abnormality. IMPRESSION: Endotracheal tube in appropriate position. Nasogastric tube advancement by 10-15 cm is recommended for optimal positioning. Right basilar consolidation in keeping with acute lobar pneumonia. COPD. Electronically Signed   By: Fidela Salisbury MD   On: 09/09/2020 19:14    Procedures Date/Time: 09/25/2020 9:17 PM Performed by: Milton Ferguson, MD Comments: Patient was in respiratory distress.  He was given 10 of etomidate and 120 of succinylcholine.  He was then intubated with a 7-1/2 tube using video-assisted intubation.  Patient was intubated on second try.  Tube placement was confirmed with auscultation and CO2 confirmation along with chest x-ray        Medications Ordered in ED Medications  norepinephrine (LEVOPHED) 4mg  in 259mL premix infusion (12 mcg/min Intravenous  Infusion Verify 09/27/2020 2018)  propofol (DIPRIVAN) 1000 MG/100ML infusion (40 mcg/kg/min  88.9 kg Intravenous Infusion Verify 10/02/2020 2051)  furosemide (LASIX) injection 40 mg (40 mg Intravenous Given 09/21/2020  1839)  methylPREDNISolone sodium succinate (SOLU-MEDROL) 125 mg/2 mL injection 125 mg (125 mg Intravenous Given 09/23/2020 1928)  magnesium sulfate IVPB 2 g 50 mL (0 g Intravenous Stopped 09/16/2020 2039)  sodium chloride 0.9 % bolus 2,000 mL (0 mLs Intravenous Stopped 09/28/2020 2018)  cefTRIAXone (ROCEPHIN) 1 g in sodium chloride 0.9 % 100 mL IVPB (0 g Intravenous Stopped 10/03/2020 2018)  azithromycin (ZITHROMAX) 500 mg in sodium chloride 0.9 % 250 mL IVPB (0 mg Intravenous Stopped 09/21/2020 2018)  midazolam (VERSED) 5 MG/5ML injection (2.5 mg Intravenous Given 09/08/2020 2017)  sodium chloride 0.9 % bolus 1,000 mL (0 mLs Intravenous Stopped 09/06/2020 1934)  ipratropium (ATROVENT) 0.02 % nebulizer solution (0.5 mg  Given 09/20/2020 1944)  albuterol (PROVENTIL) (2.5 MG/3ML) 0.083% nebulizer solution (5 mg  Given 09/07/2020 1944)  albuterol (PROVENTIL) (2.5 MG/3ML) 0.083% nebulizer solution (5 mg  Given 09/15/2020 2040)  sodium chloride 0.9 % bolus 1,000 mL (1,000 mLs Intravenous New Bag/Given 09/18/2020 2054)   CRITICAL CARE Performed by: Milton Ferguson Total critical care time: 3minutes Critical care time was exclusive of separately billable procedures and treating other patients. Critical care was necessary to treat or prevent imminent or life-threatening deterioration. Critical care was time spent personally by me on the following activities: development of treatment plan with patient and/or surrogate as well as nursing, discussions with consultants, evaluation of patient's response to treatment, examination of patient, obtaining history from patient or surrogate, ordering and performing treatments and interventions, ordering and review of laboratory studies, ordering and review of radiographic studies, pulse  oximetry and re-evaluation of patient's condition. Patient was in severe respiratory distress when he came to the emergency department..  Patient had to be intubated.  Chest x-ray shows pneumonia.  Patient became hypotensive.  He was started on Levophed and antibiotics and fluids.  I spoke with critical care and they accept the patient to be admitted over at [facility] ED Course  I have reviewed the triage vital signs and the nursing notes.  Pertinent labs & imaging results that were available during my care of the patient were reviewed by me and considered in my medical decision making (see chart for details).    MDM Rules/Calculators/A&P                          Patient septic hypotensive pneumonia intubated and will be admitted to [facility] Final Clinical Impression(s) / ED Diagnoses Final diagnoses:  None    Rx / DC Orders ED Discharge Orders    None       Milton Ferguson, MD 09/19/2020 2118

## 2020-09-22 NOTE — ED Notes (Signed)
Lasix 40 mg given IV.

## 2020-09-23 DIAGNOSIS — J189 Pneumonia, unspecified organism: Secondary | ICD-10-CM | POA: Diagnosis not present

## 2020-09-23 DIAGNOSIS — A419 Sepsis, unspecified organism: Secondary | ICD-10-CM | POA: Diagnosis not present

## 2020-09-23 DIAGNOSIS — R6521 Severe sepsis with septic shock: Secondary | ICD-10-CM | POA: Diagnosis not present

## 2020-09-23 LAB — BASIC METABOLIC PANEL
Anion gap: 5 (ref 5–15)
BUN: 15 mg/dL (ref 8–23)
CO2: 34 mmol/L — ABNORMAL HIGH (ref 22–32)
Calcium: 7.7 mg/dL — ABNORMAL LOW (ref 8.9–10.3)
Chloride: 99 mmol/L (ref 98–111)
Creatinine, Ser: 0.99 mg/dL (ref 0.61–1.24)
GFR, Estimated: >60 mL/min (ref >60)
Glucose, Bld: 176 mg/dL — ABNORMAL HIGH (ref 70–99)
Potassium: 4.4 mmol/L (ref 3.5–5.1)
Sodium: 138 mmol/L (ref 135–145)

## 2020-09-23 LAB — POCT I-STAT 7, (LYTES, BLD GAS, ICA,H+H)
Acid-Base Excess: 10 mmol/L — ABNORMAL HIGH (ref 0.0–2.0)
Bicarbonate: 35.8 mmol/L — ABNORMAL HIGH (ref 20.0–28.0)
Calcium, Ion: 1.11 mmol/L — ABNORMAL LOW (ref 1.15–1.40)
HCT: 44 % (ref 39.0–52.0)
Hemoglobin: 15 g/dL (ref 13.0–17.0)
O2 Saturation: 96 %
Patient temperature: 98.4
Potassium: 4.2 mmol/L (ref 3.5–5.1)
Sodium: 137 mmol/L (ref 135–145)
TCO2: 37 mmol/L — ABNORMAL HIGH (ref 22–32)
pCO2 arterial: 52 mmHg — ABNORMAL HIGH (ref 32.0–48.0)
pH, Arterial: 7.446 (ref 7.350–7.450)
pO2, Arterial: 83 mmHg (ref 83.0–108.0)

## 2020-09-23 LAB — GLUCOSE, CAPILLARY
Glucose-Capillary: 174 mg/dL — ABNORMAL HIGH (ref 70–99)
Glucose-Capillary: 160 mg/dL — ABNORMAL HIGH (ref 70–99)
Glucose-Capillary: 132 mg/dL — ABNORMAL HIGH (ref 70–99)
Glucose-Capillary: 117 mg/dL — ABNORMAL HIGH (ref 70–99)
Glucose-Capillary: 95 mg/dL (ref 70–99)
Glucose-Capillary: 105 mg/dL — ABNORMAL HIGH (ref 70–99)

## 2020-09-23 LAB — PHOSPHORUS: Phosphorus: 3.7 mg/dL (ref 2.5–4.6)

## 2020-09-23 LAB — MAGNESIUM: Magnesium: 2.1 mg/dL (ref 1.7–2.4)

## 2020-09-23 LAB — TRIGLYCERIDES: Triglycerides: 42 mg/dL (ref <150)

## 2020-09-23 LAB — HEMOGLOBIN A1C
Hgb A1c MFr Bld: 6.4 % — ABNORMAL HIGH (ref 4.8–5.6)
Mean Plasma Glucose: 137 mg/dL

## 2020-09-23 LAB — MRSA PCR SCREENING: MRSA by PCR: NEGATIVE

## 2020-09-23 MED ORDER — DOCUSATE SODIUM 50 MG/5ML PO LIQD
100.0000 mg | Freq: Two times a day (BID) | ORAL | Status: DC | PRN
  Filled 2020-09-23: qty 10

## 2020-09-23 MED ORDER — SODIUM CHLORIDE 0.9 % IV SOLN
250.0000 mL | INTRAVENOUS | Status: DC
  Administered 2020-09-23: 250 mL via INTRAVENOUS

## 2020-09-23 MED ORDER — LACTATED RINGERS IV SOLN
INTRAVENOUS | Status: DC
  Administered 2020-09-23: 1000 mL via INTRAVENOUS

## 2020-09-23 MED ORDER — PRAMIPEXOLE DIHYDROCHLORIDE 0.25 MG PO TABS
0.2500 mg | ORAL_TABLET | Freq: Every evening | ORAL | Status: DC
  Administered 2020-09-23 – 2020-10-04 (×12): 0.25 mg
  Filled 2020-09-23 (×13): qty 1

## 2020-09-23 MED ORDER — FENTANYL CITRATE (PF) 100 MCG/2ML IJ SOLN
25.0000 ug | INTRAMUSCULAR | Status: DC | PRN
  Administered 2020-09-23 – 2020-09-24 (×5): 100 ug via INTRAVENOUS
  Filled 2020-09-23 (×4): qty 2

## 2020-09-23 MED ORDER — VITAL AF 1.2 CAL PO LIQD
1000.0000 mL | ORAL | Status: DC
  Administered 2020-09-23 – 2020-09-27 (×7): 1000 mL
  Filled 2020-09-23 (×7): qty 1000

## 2020-09-23 MED ORDER — POLYETHYLENE GLYCOL 3350 17 G PO PACK
17.0000 g | PACK | Freq: Every day | ORAL | Status: DC | PRN
  Administered 2020-09-25 – 2020-09-27 (×3): 17 g
  Filled 2020-09-23 (×3): qty 1

## 2020-09-23 MED ORDER — NOREPINEPHRINE 4 MG/250ML-% IV SOLN: 2.0000 ug/min | INTRAVENOUS | Status: DC

## 2020-09-23 NOTE — Progress Notes (Signed)
NAME:  Paul Branch, MRN:  [id number], DOB:  03-08-1956, LOS: 1 ADMISSION DATE:  09/07/2020, CONSULTATION DATE:  09/23/2020 REFERRING MD:  ED CHIEF COMPLAINT:  Respiratory distress  History of Present Illness:  Paul Branch is a 65 year old gentleman with PMH significant for COPD on home Oxygen of 2 liters [place], nicotine dependency, hypertension and hyperlipidemia.  Patient presented to [facility] via EMS with worsening dysnoea.  He was found to be hypoxic with saturations of 70% on 2 liters nasal cannula Oxygen. Patient's wife reported that patient had become more short of breath. His mental status became impaired so EMS was called. The patient was initially placed on 100% FIO2 but soon afterward intubated because of hypercarbia and hypoxia. His COVID 19 and Influenza panel were negative. It is reported that patient received diuretic therapy in the ED which cause 1800 ml diuresis followed by hypotension.  Hence he was given IVF and required Levophed post intubation.  CXR is consistent with right lobar pneumonia.  Pertinent  Medical History  COPD, chronic respiratory failure with hypercapnia and hypoxemia  Significant Hospital Events:   . 4/17 admitted, intubated CAP, COPD exacerbation, CTX/Azithro  Interim History / Subjective:  NAEON, weaning FiO2 and NE  Objective   Blood pressure 122/81, pulse 72, temperature 98.06 F (36.7 C), resp. rate (!) 21, height 5\' 11"  (1.803 m), weight 91.2 kg, SpO2 90 %.    Vent Mode: PRVC FiO2 (%):  [80 %-100 %] 80 % Set Rate:  [20 bmp-30 bmp] 26 bmp Vt Set:  [420 mL-600 mL] 530 mL PEEP:  [5 cmH20-8 cmH20] 8 cmH20 Plateau Pressure:  [19 cmH20-39 cmH20] 32 cmH20   Intake/Output Summary (Last 24 hours) at 09/23/2020 1884 Last data filed at 09/23/2020 0800 Gross per 24 hour  Intake 2496.49 ml  Output 2388 ml  Net 108.49 ml   Filed Weights   09/10/2020 2033 10/02/2020 2300 09/23/20 0340  Weight: 88.9 kg 91.2 kg 91.2 kg     Examination: General: sedated, lying in bed HENT: AT, [place] Lungs: clear, coarse, ventilated sounds Cardiovascular: RRR, no murmur Abdomen: ND, BS present Extremities: no edema Neuro: sedated, intuabted   Labs/imaging that I havepersonally reviewed    CXR, CBC, chemistries, BNP, troponin, ABG, microbiology  Resolved Hospital Problem list   n/a  Assessment & Plan:  Acute on chronic hypoxic and hypercarbic respiratory failure secondary to community acquired right lobar pneumonia (CAP)  and acute exacerbation of COPD. COVID negative, influenza. MRSA PCR negative. --f/u LRCx --CAP coverage CTX, Azithro (4/17--) --PRVC, decrease rate, PSV trial 4/19  Septic shock secondary to CAP --MAP > 65, wean NE --High FiO2, consider more IVF, limited by FiO2, BNP elevated --Wean prop, maximize fentanyl  Elevated troponin: Demand ischemia -Continue ASA  H/O hypertension -- hold home meds in setting of septic shock.  H/O hyperlipidemia --continue Lipitor.  Best practice (right click and "Reselect all SmartList Selections" daily)  Diet:  Tube Feed  Pain/Anxiety/Delirium protocol (if indicated): Yes (RASS goal -2) VAP protocol (if indicated): Yes DVT prophylaxis: LMWH GI prophylaxis: PPI Glucose control:  SSI Yes Central venous access:  N/A Arterial line:  N/A Foley:  Yes, and it is still needed Mobility:  bed rest  PT consulted: N/A Last date of multidisciplinary goals of care discussion [n/a] Code Status:  full code Disposition: ICU  Labs   CBC: Recent Labs  Lab 09/12/2020 1904 09/28/2020 2330 09/23/20 0105 09/23/20 0748  WBC 14.6*  --  22.6*  --  NEUTROABS 13.1*  --   --   --   HGB 16.1 17.3* 14.5 15.0  HCT 56.4* 51.0 48.7 44.0  MCV 106.8*  --  105.4*  --   PLT PLATELET CLUMPS NOTED ON SMEAR, COUNT APPEARS ADEQUATE  --  115*  --     Basic Metabolic Panel: Recent Labs  Lab 09/09/2020 1904 09/25/2020 2330 09/23/20 0105 09/23/20 0748  NA 139 139 138 137  K 5.0  4.3 4.4 4.2  CL 95*  --  99  --   CO2 35*  --  34*  --   GLUCOSE 174*  --  176*  --   BUN 20  --  15  --   CREATININE 1.05  --  1.05  0.99  --   CALCIUM 8.7*  --  7.7*  --    GFR: Estimated Creatinine Clearance: 80 mL/min (by C-G formula based on SCr of 1.05 mg/dL). Recent Labs  Lab 09/13/2020 1904 09/29/2020 2128 09/23/20 0105  WBC 14.6*  --  22.6*  LATICACIDVEN 2.5* 1.4  --     Liver Function Tests: Recent Labs  Lab 09/29/2020 1904  AST 24  ALT 29  ALKPHOS 53  BILITOT 0.9  PROT 7.2  ALBUMIN 4.1   No results for input(s): LIPASE, AMYLASE in the last 168 hours. No results for input(s): AMMONIA in the last 168 hours.  ABG    Component Value Date/Time   PHART 7.446 09/23/2020 0748   PCO2ART 52.0 (H) 09/23/2020 0748   PO2ART 83 09/23/2020 0748   HCO3 35.8 (H) 09/23/2020 0748   TCO2 37 (H) 09/23/2020 0748   O2SAT 96.0 09/23/2020 0748     Coagulation Profile: Recent Labs  Lab 09/12/2020 1904  INR 1.0    Cardiac Enzymes: No results for input(s): CKTOTAL, CKMB, CKMBINDEX, TROPONINI in the last 168 hours.  HbA1C: No results found for: HGBA1C  CBG: Recent Labs  Lab 09/15/2020 2328 09/23/20 0321 09/23/20 0747  GLUCAP 163* 174* 160*    Review of Systems:   n/a  Past Medical History:  He,  has a past medical history of COPD (chronic obstructive pulmonary disease) ([place]), DDD (degenerative disc disease), thoracolumbar (01/25/2013), Depression, Disease characterized by destruction of skeletal muscle (02/12/2019), Hyperlipidemia, Hypertension, Stroke ([place]), and 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   Surgical History:   Past Surgical History:  Procedure Laterality Date  . APPENDECTOMY    . ELECTROMAGNETIC NAVIGATION BROCHOSCOPY Left 02/10/2019   Procedure: ELECTROMAGNETIC NAVIGATION BRONCHOSCOPY, LEFT;  Surgeon: Tyler Pita, MD;  Location: [facility] ORS;  Service: Cardiopulmonary;  Laterality: Left;  . pinched nerve      neck     Social History:   reports that he has  been smoking cigarettes. He has a 11.50 pack-year smoking history. He has never used smokeless tobacco. He reports that he does not drink alcohol and does not use drugs.   Family History:  His family history includes Stroke in his mother.   Allergies No Known Allergies   Home Medications  Prior to Admission medications   Medication Sig Start Date End Date Taking? Authorizing Provider  amLODipine (NORVASC) 2.5 MG tablet Take by mouth. 09/18/20 09/18/21 Yes [provider]  budesonide (PULMICORT) 0.5 MG/2ML nebulizer solution Inhale into the lungs. 09/18/20 09/18/21 Yes [provider]  metoprolol succinate (TOPROL-XL) 25 MG 24 hr tablet Take 1 tablet by mouth daily. 04/11/20 04/11/21 Yes [provider]  predniSONE (DELTASONE) 10 MG tablet Take 4 tabs on days 1-3, then 3 tabs on  days 4-6, then 2 tabs on days 7-9 then 1 tab on day 10 then off 09/18/20  Yes [provider]  tadalafil (CIALIS) 5 MG tablet Take by mouth. 04/12/20 04/12/21 Yes [provider]  albuterol (PROVENTIL HFA;VENTOLIN HFA) 108 (90 Base) MCG/ACT inhaler  06/21/18   [provider]  albuterol (PROVENTIL) (2.5 MG/3ML) 0.083% nebulizer solution Take 3 mLs by nebulization in the morning, at noon, in the evening, and at bedtime. 03/26/20   [provider]  ANORO ELLIPTA 62.5-25 MCG/INH AEPB Inhale 1 puff into the lungs daily. 03/26/20   [provider]  aspirin EC 81 MG tablet Take 81 mg by mouth daily.  10/23/10   [provider]  atorvastatin (LIPITOR) 20 MG tablet Take 20 mg by mouth daily.  11/03/17 04/07/20  [provider]  famotidine (PEPCID) 20 MG tablet Take 1 tablet (20 mg total) by mouth daily. 04/09/20 05/09/20  Nolberto Hanlon, MD  Fluticasone-Umeclidin-Vilant (TRELEGY ELLIPTA) 100-62.5-25 MCG/INH AEPB Inhale 1 puff into the lungs daily. Patient not taking: Reported on 04/07/2020 02/02/19   Tyler Pita, MD  melatonin 5 MG TABS Take 5 mg  by mouth at bedtime as needed.    [provider]  pramipexole (MIRAPEX) 0.25 MG tablet Take 0.25 mg by mouth every evening.  10/04/17   [provider]  varenicline (CHANTIX) 1 MG tablet Take by mouth. 09/19/20   [provider]     Critical care time:    CRITICAL CARE Performed by: Lanier Clam   Total critical care time: 40 minutes  Critical care time was exclusive of separately billable procedures and treating other patients.  Critical care was necessary to treat or prevent imminent or life-threatening deterioration.  Critical care was time spent personally by me on the following activities: development of treatment plan with patient and/or surrogate as well as nursing, discussions with consultants, evaluation of patient's response to treatment, examination of patient, obtaining history from patient or surrogate, ordering and performing treatments and interventions, ordering and review of laboratory studies, ordering and review of radiographic studies, pulse oximetry and re-evaluation of patient's condition.

## 2020-09-23 NOTE — Progress Notes (Signed)
eLink Physician-Brief Progress Note Patient Name: Paul Branch DOB: 1955/07/21 MRN: [id number]   Date of Service  09/23/2020  HPI/Events of Note  Received request to modify Fentanyl to IV push instead of via bolus  eICU Interventions  Fentanyl push ordered and Fentanyl via bolus discontinued     Intervention Category Minor Interventions: Routine modifications to care plan (e.g. PRN medications for pain, fever)  Shona Needles Yalonda Sample 09/23/2020, 9:19 PM

## 2020-09-23 NOTE — Progress Notes (Signed)
Initial Nutrition Assessment  DOCUMENTATION CODES:   Not applicable  INTERVENTION:   Initiate tube feeding via OG tube: Vital AF 1.2 at 80 ml/h (1920 ml per day)  Provides 2304 kcal, 144 gm protein, 1557 ml free water daily  NUTRITION DIAGNOSIS:   Inadequate oral intake related to inability to eat as evidenced by NPO status.  GOAL:   Patient will meet greater than or equal to 90% of their needs  MONITOR:   Vent status,TF tolerance,Labs  REASON FOR ASSESSMENT:   Ventilator,Consult Enteral/tube feeding initiation and management  ASSESSMENT:   65 yo male admitted with respiratory distress, R lobar PNA. PMH includes COPD on home oxygen 2L [place], HTN, HLD.   Discussed patient in ICU rounds and with RN today. Received MD Consult for TF initiation and management. OG tube in place.  Propofol and levophed are off.   Patient is currently intubated on ventilator support MV: 13.8 L/min Temp (24hrs), Avg:98.6 F (37 C), Min:97.88 F (36.6 C), Max:100.8 F (38.2 C)  Propofol: off  Labs reviewed.  CBG: 174-160-132  Medications reviewed and include colace, novolog, solumedrol, protonix, miralax.  I/O: +285 ml since admission  Usual weights reviewed. Patient weighed 80.7 kg 6 months PTA, currently 91.2 kg, increase likely d/t fluids.   NUTRITION - FOCUSED PHYSICAL EXAM:  Flowsheet Row Most Recent Value  Orbital Region No depletion  Upper Arm Region No depletion  Thoracic and Lumbar Region No depletion  Buccal Region Unable to assess  Temple Region No depletion  Clavicle Bone Region No depletion  Clavicle and Acromion Bone Region No depletion  Scapular Bone Region Unable to assess  Dorsal Hand No depletion  Patellar Region No depletion  Anterior Thigh Region No depletion  Posterior Calf Region No depletion  Edema (RD Assessment) Mild  Hair Reviewed  Eyes Unable to assess  Mouth Unable to assess  Skin Reviewed  Nails Reviewed       Diet Order:   Diet Order             Diet NPO time specified  Diet effective now                 EDUCATION NEEDS:   Not appropriate for education at this time  Skin:  Skin Assessment: Reviewed RN Assessment  Last BM:  no BM documented  Height:   Ht Readings from Last 1 Encounters:  09/13/2020 5\' 11"  (1.803 m)    Weight:   Wt Readings from Last 1 Encounters:  09/23/20 91.2 kg    BMI:  Body mass index is 28.04 kg/m.  Estimated Nutritional Needs:   Kcal:  2245  Protein:  135-155 gm  Fluid:  >/= 2.2 L    Lucas Mallow, RD, LDN, CNSC Please refer to Amion for contact information.

## 2020-09-23 NOTE — Progress Notes (Signed)
[facility] Progress Note Patient Name: Paul Branch DOB: 06-Oct-1955 MRN: [id number]   Date of Service  09/23/2020  HPI/Events of Note  ABG reviewed and shows respiratory acidosis.  eICU Interventions  Respiratory rate increased to 30.        Dorie Ohms U [place] Herd 09/23/2020, 1:15 AM

## 2020-09-24 ENCOUNTER — Inpatient Hospital Stay (HOSPITAL_COMMUNITY): Payer: Medicare HMO

## 2020-09-24 DIAGNOSIS — J95811 Postprocedural pneumothorax: Secondary | ICD-10-CM | POA: Diagnosis not present

## 2020-09-24 DIAGNOSIS — J189 Pneumonia, unspecified organism: Secondary | ICD-10-CM | POA: Diagnosis not present

## 2020-09-24 LAB — CBC
HCT: 46.2 % (ref 39.0–52.0)
Hemoglobin: 14.1 g/dL (ref 13.0–17.0)
MCH: 31.3 pg (ref 26.0–34.0)
MCHC: 30.5 g/dL (ref 30.0–36.0)
MCV: 102.4 fL — ABNORMAL HIGH (ref 80.0–100.0)
Platelets: 119 10*3/uL — ABNORMAL LOW (ref 150–400)
RBC: 4.51 MIL/uL (ref 4.22–5.81)
RDW: 13.8 % (ref 11.5–15.5)
WBC: 21.3 10*3/uL — ABNORMAL HIGH (ref 4.0–10.5)
nRBC: 0 % (ref 0.0–0.2)

## 2020-09-24 LAB — BASIC METABOLIC PANEL
Anion gap: 7 (ref 5–15)
BUN: 26 mg/dL — ABNORMAL HIGH (ref 8–23)
CO2: 33 mmol/L — ABNORMAL HIGH (ref 22–32)
Calcium: 8.5 mg/dL — ABNORMAL LOW (ref 8.9–10.3)
Chloride: 98 mmol/L (ref 98–111)
Creatinine, Ser: 1.09 mg/dL (ref 0.61–1.24)
GFR, Estimated: >60 mL/min (ref >60)
Glucose, Bld: 110 mg/dL — ABNORMAL HIGH (ref 70–99)
Potassium: 5.4 mmol/L — ABNORMAL HIGH (ref 3.5–5.1)
Sodium: 138 mmol/L (ref 135–145)

## 2020-09-24 LAB — GLUCOSE, CAPILLARY
Glucose-Capillary: 107 mg/dL — ABNORMAL HIGH (ref 70–99)
Glucose-Capillary: 109 mg/dL — ABNORMAL HIGH (ref 70–99)
Glucose-Capillary: 119 mg/dL — ABNORMAL HIGH (ref 70–99)
Glucose-Capillary: 126 mg/dL — ABNORMAL HIGH (ref 70–99)
Glucose-Capillary: 138 mg/dL — ABNORMAL HIGH (ref 70–99)
Glucose-Capillary: 97 mg/dL (ref 70–99)

## 2020-09-24 LAB — POCT I-STAT 7, (LYTES, BLD GAS, ICA,H+H)
Acid-Base Excess: 10 mmol/L — ABNORMAL HIGH (ref 0.0–2.0)
Bicarbonate: 37.3 mmol/L — ABNORMAL HIGH (ref 20.0–28.0)
Calcium, Ion: 1.18 mmol/L (ref 1.15–1.40)
HCT: 46 % (ref 39.0–52.0)
Hemoglobin: 15.6 g/dL (ref 13.0–17.0)
O2 Saturation: 87 %
Patient temperature: 98.1
Potassium: 4.2 mmol/L (ref 3.5–5.1)
Sodium: 138 mmol/L (ref 135–145)
TCO2: 39 mmol/L — ABNORMAL HIGH (ref 22–32)
pCO2 arterial: 56.9 mmHg — ABNORMAL HIGH (ref 32.0–48.0)
pH, Arterial: 7.423 (ref 7.350–7.450)
pO2, Arterial: 52 mmHg — ABNORMAL LOW (ref 83.0–108.0)

## 2020-09-24 LAB — URINE CULTURE: Culture: NO GROWTH

## 2020-09-24 LAB — PHOSPHORUS
Phosphorus: 3.4 mg/dL (ref 2.5–4.6)
Phosphorus: 5.3 mg/dL — ABNORMAL HIGH (ref 2.5–4.6)

## 2020-09-24 LAB — MAGNESIUM
Magnesium: 2.3 mg/dL (ref 1.7–2.4)
Magnesium: 2.4 mg/dL (ref 1.7–2.4)

## 2020-09-24 MED ORDER — ETOMIDATE 2 MG/ML IV SOLN: 20.0000 mg | Freq: Once | INTRAVENOUS | Status: AC

## 2020-09-24 MED ORDER — PROPOFOL 1000 MG/100ML IV EMUL: 5.0000 ug/kg/min | INTRAVENOUS | Status: DC

## 2020-09-24 MED ORDER — HYDRALAZINE HCL 20 MG/ML IJ SOLN
10.0000 mg | INTRAMUSCULAR | Status: DC | PRN
  Administered 2020-09-24 – 2020-09-26 (×3): 10 mg via INTRAVENOUS
  Filled 2020-09-24 (×4): qty 1

## 2020-09-24 MED ORDER — METHYLPREDNISOLONE SODIUM SUCC 125 MG IJ SOLR
40.0000 mg | Freq: Every day | INTRAMUSCULAR | Status: AC
  Administered 2020-09-24 – 2020-09-27 (×4): 40 mg via INTRAVENOUS
  Filled 2020-09-24 (×4): qty 2

## 2020-09-24 MED ORDER — ROCURONIUM BROMIDE 10 MG/ML (PF) SYRINGE
PREFILLED_SYRINGE | INTRAVENOUS | Status: AC
  Administered 2020-09-24: 50 mg
  Filled 2020-09-24: qty 10

## 2020-09-24 MED ORDER — MIDAZOLAM HCL 2 MG/2ML IJ SOLN
INTRAMUSCULAR | Status: AC
  Filled 2020-09-24: qty 2

## 2020-09-24 MED ORDER — FENTANYL CITRATE (PF) 100 MCG/2ML IJ SOLN
INTRAMUSCULAR | Status: AC
  Filled 2020-09-24: qty 2

## 2020-09-24 MED ORDER — NOREPINEPHRINE 4 MG/250ML-% IV SOLN
0.0000 ug/min | INTRAVENOUS | Status: DC
Start: 2020-09-24 — End: 2020-09-30
  Administered 2020-09-24: 5 ug/min via INTRAVENOUS
  Administered 2020-09-25: 3 ug/min via INTRAVENOUS
  Administered 2020-09-26: 2 ug/min via INTRAVENOUS
  Filled 2020-09-24 (×2): qty 250

## 2020-09-24 MED ORDER — NOREPINEPHRINE 4 MG/250ML-% IV SOLN
INTRAVENOUS | Status: AC
  Filled 2020-09-24: qty 250

## 2020-09-24 MED ORDER — FUROSEMIDE 10 MG/ML IJ SOLN
INTRAMUSCULAR | Status: AC
  Filled 2020-09-24: qty 2

## 2020-09-24 MED ORDER — KETAMINE HCL 50 MG/5ML IJ SOSY
PREFILLED_SYRINGE | INTRAMUSCULAR | Status: AC
  Filled 2020-09-24: qty 5

## 2020-09-24 MED ORDER — ETOMIDATE 2 MG/ML IV SOLN
INTRAVENOUS | Status: AC
  Administered 2020-09-24: 20 mg via INTRAVENOUS
  Filled 2020-09-24: qty 20

## 2020-09-24 MED ORDER — PANTOPRAZOLE SODIUM 40 MG PO PACK
40.0000 mg | PACK | Freq: Every day | ORAL | Status: DC
  Administered 2020-09-24 – 2020-10-05 (×12): 40 mg
  Filled 2020-09-24 (×12): qty 20

## 2020-09-24 MED ORDER — VECURONIUM BROMIDE 10 MG IV SOLR
INTRAVENOUS | Status: AC
  Administered 2020-09-24: 8 mg via INTRAVENOUS
  Filled 2020-09-24: qty 10

## 2020-09-24 MED ORDER — ROCURONIUM BROMIDE 50 MG/5ML IV SOLN
50.0000 mg | Freq: Once | INTRAVENOUS | Status: AC
  Filled 2020-09-24: qty 5

## 2020-09-24 MED ORDER — SODIUM CHLORIDE 0.9 % IV BOLUS
500.0000 mL | Freq: Once | INTRAVENOUS | Status: AC
  Administered 2020-09-24: 500 mL via INTRAVENOUS

## 2020-09-24 MED ORDER — VECURONIUM BROMIDE 10 MG IV SOLR: 8.0000 mg | Freq: Once | INTRAVENOUS | Status: AC

## 2020-09-24 MED ORDER — FENTANYL CITRATE (PF) 100 MCG/2ML IJ SOLN
100.0000 ug | INTRAMUSCULAR | Status: DC | PRN
  Administered 2020-09-24 – 2020-09-30 (×26): 100 ug via INTRAVENOUS
  Administered 2020-09-30: 50 ug via INTRAVENOUS
  Administered 2020-09-30: 100 ug via INTRAVENOUS
  Administered 2020-09-30 (×3): 50 ug via INTRAVENOUS
  Administered 2020-09-30 (×3): 100 ug via INTRAVENOUS
  Administered 2020-09-30: 50 ug via INTRAVENOUS
  Administered 2020-10-01 – 2020-10-05 (×3): 100 ug via INTRAVENOUS
  Filled 2020-09-24 (×8): qty 2

## 2020-09-24 MED ORDER — PROPOFOL 1000 MG/100ML IV EMUL
0.0000 ug/kg/min | INTRAVENOUS | Status: DC
  Administered 2020-09-24: 5 ug/kg/min via INTRAVENOUS
  Administered 2020-09-24 – 2020-09-25 (×3): 20 ug/kg/min via INTRAVENOUS
  Administered 2020-09-26: 40 ug/kg/min via INTRAVENOUS
  Administered 2020-09-26: 20 ug/kg/min via INTRAVENOUS
  Administered 2020-09-26: 30 ug/kg/min via INTRAVENOUS
  Administered 2020-09-26: 20 ug/kg/min via INTRAVENOUS
  Administered 2020-09-26 – 2020-09-27 (×2): 40 ug/kg/min via INTRAVENOUS
  Administered 2020-09-27: 50 ug/kg/min via INTRAVENOUS
  Administered 2020-09-27: 30 ug/kg/min via INTRAVENOUS
  Administered 2020-09-27: 35 ug/kg/min via INTRAVENOUS
  Administered 2020-09-27: 50 ug/kg/min via INTRAVENOUS
  Administered 2020-09-28: 35 ug/kg/min via INTRAVENOUS
  Administered 2020-09-28: 50 ug/kg/min via INTRAVENOUS
  Administered 2020-09-28: 40 ug/kg/min via INTRAVENOUS
  Administered 2020-09-28: 50 ug/kg/min via INTRAVENOUS
  Administered 2020-09-28 (×2): 40 ug/kg/min via INTRAVENOUS
  Administered 2020-09-29: 30 ug/kg/min via INTRAVENOUS
  Administered 2020-09-29: 50 ug/kg/min via INTRAVENOUS
  Administered 2020-09-29: 45 ug/kg/min via INTRAVENOUS
  Administered 2020-09-29: 30 ug/kg/min via INTRAVENOUS
  Administered 2020-09-30 (×2): 20 ug/kg/min via INTRAVENOUS
  Filled 2020-09-24 (×26): qty 100

## 2020-09-24 MED ORDER — FUROSEMIDE 10 MG/ML IJ SOLN
20.0000 mg | Freq: Once | INTRAMUSCULAR | Status: AC
  Administered 2020-09-24: 20 mg via INTRAVENOUS

## 2020-09-24 NOTE — Progress Notes (Addendum)
NAME:  Abdulraheem Pineo, MRN:  [id number], DOB:  1955/12/15, LOS: 2 ADMISSION DATE:  09/21/2020, CONSULTATION DATE:  09/18/2020 REFERRING MD:  ED CHIEF COMPLAINT:  Respiratory distress  History of Present Illness:  Kiven L. Kolbe is a 65 year old gentleman with PMH significant for COPD on home Oxygen of 2 liters [place], nicotine dependency, hypertension and hyperlipidemia.  Patient presented to [facility] via EMS with worsening dysnoea.  He was found to be hypoxic with saturations of 70% on 2 liters nasal cannula Oxygen. Patient's wife reported that patient had become more short of breath. His mental status became impaired so EMS was called. The patient was initially placed on 100% FIO2 but soon afterward intubated because of hypercarbia and hypoxia. His COVID 19 and Influenza panel were negative. It is reported that patient received diuretic therapy in the ED which cause 1800 ml diuresis followed by hypotension.  Hence he was given IVF and required Levophed post intubation.  CXR is consistent with right lobar pneumonia.  Pertinent  Medical History  COPD, chronic respiratory failure with hypercapnia and hypoxemia  Significant Hospital Events:   . 4/17 admitted, intubated CAP, COPD exacerbation, CTX/Azithro . 4/18 Alert communicative, needs more sedation although not fighting vent  Interim History / Subjective:  NAEON, FiO2 unchanged, very hypoxemic. Alert, communicative via writing, Bps up.  Objective   Blood pressure (!) 152/99, pulse (!) 111, temperature 98.5 F (36.9 C), temperature source Oral, resp. rate (!) 24, height 5\' 11"  (1.803 m), weight 90 kg, SpO2 96 %.    Vent Mode: PRVC FiO2 (%):  [85 %-100 %] 85 % Set Rate:  [26 bmp] 26 bmp Vt Set:  [530 mL] 530 mL PEEP:  [10 cmH20] 10 cmH20 Plateau Pressure:  [24 cmH20-31 cmH20] 31 cmH20   Intake/Output Summary (Last 24 hours) at 09/24/2020 1053 Last data filed at 09/24/2020 1000 Gross per 24 hour  Intake 1914.32 ml  Output 650  ml  Net 1264.32 ml   Filed Weights   10/01/2020 2300 09/23/20 0340 09/24/20 0500  Weight: 91.2 kg 91.2 kg 90 kg    Examination: General: awake, lying in bed HENT: AT, [place] Lungs: clear, coarse, ventilated sounds Cardiovascular: RRR, no murmur Abdomen: ND, BS present Extremities: no edema Neuro: alert, communicative, intubated   Labs/imaging that I havepersonally reviewed    CXR, CBC, chemistries, BNP, troponin, ABG, microbiology  Resolved Hospital Problem list   n/a  Assessment & Plan:  Acute on chronic hypoxic and hypercarbic respiratory failure secondary to community acquired right lobar pneumonia (CAP)  and acute exacerbation of COPD. COVID negative, influenza. MRSA PCR negative. Bad emphysema CT 2020, large shunt via Pna, will take many days to improve. --f/u LRCx --CAP coverage CTX, Azithro (4/17--) --PRVC, VAP bundle --Solumedrol 40 mg IV daily --Consider diuresis this afternoon  Septic shock secondary to CAP: Resolved --Avoid prop, use fentanyl  Elevated troponin: Demand ischemia -Continue ASA  H/O hypertension --Resume metoprolol --hydral PRN, consider diuresis, consider resuming amlodipine  H/O hyperlipidemia --continue Lipitor  Best practice (right click and "Reselect all SmartList Selections" daily)  Diet:  Tube Feed  Pain/Anxiety/Delirium protocol (if indicated): Yes (RASS goal -2) VAP protocol (if indicated): Yes DVT prophylaxis: LMWH GI prophylaxis: PPI Glucose control:  SSI Yes Central venous access:  N/A Arterial line:  N/A Foley:  Yes, and it is still needed Mobility:  bed rest  PT consulted: N/A Last date of multidisciplinary goals of care discussion [Updated at bedside 4/19, patient indicated via writing  that his son Amilio Zehnder is the designated Media planner ] Code Status:  full code Disposition: ICU  Labs   CBC: Recent Labs  Lab 09/07/2020 1904 09/15/2020 2330 09/23/20 0105 09/23/20 0748 09/24/20 0119 09/24/20 0520  WBC  14.6*  --  22.6*  --  21.3*  --   NEUTROABS 13.1*  --   --   --   --   --   HGB 16.1 17.3* 14.5 15.0 14.1 15.6  HCT 56.4* 51.0 48.7 44.0 46.2 46.0  MCV 106.8*  --  105.4*  --  102.4*  --   PLT PLATELET CLUMPS NOTED ON SMEAR, COUNT APPEARS ADEQUATE  --  115*  --  119*  --     Basic Metabolic Panel: Recent Labs  Lab 09/07/2020 1904 09/30/2020 2330 09/23/20 0105 09/23/20 0748 09/23/20 1447 09/24/20 0119 09/24/20 0520  NA 139 139 138 137  --  138 138  K 5.0 4.3 4.4 4.2  --  5.4* 4.2  CL 95*  --  99  --   --  98  --   CO2 35*  --  34*  --   --  33*  --   GLUCOSE 174*  --  176*  --   --  110*  --   BUN 20  --  15  --   --  26*  --   CREATININE 1.05  --  1.05  0.99  --   --  1.09  --   CALCIUM 8.7*  --  7.7*  --   --  8.5*  --   MG  --   --   --   --  2.1 2.3  --   PHOS  --   --   --   --  3.7 3.4  --    GFR: Estimated Creatinine Clearance: 71 mL/min (by C-G formula based on SCr of 1.09 mg/dL). Recent Labs  Lab 10/03/2020 1904 09/21/2020 2128 09/23/20 0105 09/24/20 0119  WBC 14.6*  --  22.6* 21.3*  LATICACIDVEN 2.5* 1.4  --   --     Liver Function Tests: Recent Labs  Lab 09/06/2020 1904  AST 24  ALT 29  ALKPHOS 53  BILITOT 0.9  PROT 7.2  ALBUMIN 4.1   No results for input(s): LIPASE, AMYLASE in the last 168 hours. No results for input(s): AMMONIA in the last 168 hours.  ABG    Component Value Date/Time   PHART 7.423 09/24/2020 0520   PCO2ART 56.9 (H) 09/24/2020 0520   PO2ART 52 (L) 09/24/2020 0520   HCO3 37.3 (H) 09/24/2020 0520   TCO2 39 (H) 09/24/2020 0520   O2SAT 87.0 09/24/2020 0520     Coagulation Profile: Recent Labs  Lab 09/07/2020 1904  INR 1.0    Cardiac Enzymes: No results for input(s): CKTOTAL, CKMB, CKMBINDEX, TROPONINI in the last 168 hours.  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   Surgical History:   Past Surgical History:  Procedure Laterality Date  . APPENDECTOMY    . ELECTROMAGNETIC NAVIGATION BROCHOSCOPY Left 02/10/2019   Procedure: ELECTROMAGNETIC NAVIGATION BRONCHOSCOPY, LEFT;  Surgeon: Tyler Pita, MD;  Location: [facility] ORS;  Service: Cardiopulmonary;  Laterality: Left;  . pinched nerve      neck     Social History:   reports that he has been smoking cigarettes. He has a 11.50 pack-year smoking history. He has never used smokeless tobacco. He reports that he does not drink alcohol and does not use drugs.   Family History:  His family history includes Stroke in his mother.   Allergies No Known Allergies   Home Medications  Prior to Admission medications   Medication Sig Start Date End Date Taking? Authorizing Provider  amLODipine (NORVASC) 2.5 MG tablet Take by mouth. 09/18/20 09/18/21 Yes [provider]  budesonide (PULMICORT) 0.5 MG/2ML nebulizer solution Inhale into the lungs. 09/18/20 09/18/21 Yes [provider]  metoprolol succinate (TOPROL-XL) 25 MG 24 hr tablet Take 1 tablet by mouth daily. 04/11/20 04/11/21 Yes [provider]  predniSONE (DELTASONE) 10 MG tablet Take 4 tabs on days 1-3, then 3 tabs on days 4-6, then 2 tabs on days 7-9 then 1 tab on day 10 then off 09/18/20  Yes [provider]  tadalafil (CIALIS) 5 MG tablet Take by mouth. 04/12/20 04/12/21 Yes [provider]  albuterol (PROVENTIL HFA;VENTOLIN HFA) 108 (90 Base) MCG/ACT inhaler  06/21/18   [provider]  albuterol (PROVENTIL) (2.5 MG/3ML) 0.083% nebulizer solution Take 3 mLs by nebulization in the morning, at noon, in the evening, and at bedtime. 03/26/20   [provider]  ANORO ELLIPTA 62.5-25 MCG/INH AEPB Inhale 1 puff into the lungs daily. 03/26/20   [provider]  aspirin EC 81 MG tablet Take 81 mg by mouth daily.  10/23/10   [provider]  atorvastatin (LIPITOR) 20 MG tablet Take 20 mg by mouth daily.  11/03/17 04/07/20  [provider]  famotidine (PEPCID) 20 MG tablet Take 1 tablet (20 mg total) by mouth daily. 04/09/20 05/09/20  Nolberto Hanlon, MD  Fluticasone-Umeclidin-Vilant (TRELEGY ELLIPTA) 100-62.5-25 MCG/INH AEPB Inhale 1 puff into the lungs daily. Patient not taking: Reported on 04/07/2020 02/02/19   Tyler Pita, MD  melatonin 5 MG TABS Take 5 mg by mouth at bedtime as needed.    [provider]  pramipexole (MIRAPEX) 0.25 MG tablet Take 0.25 mg by mouth every evening.  10/04/17   [provider]  varenicline (CHANTIX) 1 MG tablet Take by mouth. 09/19/20   [provider]     Critical care time:    CRITICAL CARE Performed by: Lanier Clam   Total critical care time: 35 minutes  Critical care time was exclusive of separately billable procedures and treating other patients.  Critical care was necessary to treat or prevent imminent or life-threatening deterioration.  Critical care was time spent personally by me on the following activities: development of treatment plan with patient and/or surrogate as well as nursing, discussions with consultants, evaluation of patient's response to treatment, examination of patient, obtaining history from patient or surrogate, ordering and performing treatments and interventions, ordering and review of laboratory studies, ordering and review of  radiographic  studies, pulse oximetry and re-evaluation of patient's condition.

## 2020-09-24 NOTE — Progress Notes (Signed)
Pt. O2 saturations 84-88 on 100% FiO2. Chest tube hooked up to suction, bubbling in chambers 1-2. Notified Elink, and STAT chest x-ray ordered. MD videoed into room and reviewed chest x-ray. MD stated nothing to change on current vent settings and goal SpO2 mid to upper 80's. Will continue to monitor closely.

## 2020-09-24 NOTE — Procedures (Signed)
Insertion of Chest Tube Procedure Note  Paul Branch  [id number]  Sep 14, 1955  Date:09/24/20  Time:4:37 PM    Provider Performing: Lestine Mount   Procedure: Pleural Catheter Insertion w/o Imaging Guidance [phone])  Indication(s) Pneumothorax  Consent Risks of the procedure as well as the alternatives and risks of each were explained to the patient and/or caregiver.  Consent for the procedure was obtained and is signed in the bedside chart  Anesthesia Topical only with 1% lidocaine   Time Out Verified patient identification, verified procedure, site/side was marked, verified correct patient position, special equipment/implants available, medications/allergies/relevant history reviewed, required imaging and test results available.  Sterile Technique Maximal sterile technique including full sterile barrier drape, hand hygiene, sterile gown, sterile gloves, mask, hair covering, sterile ultrasound probe cover (if used).  Procedure Description Area of placement cleaned and draped in sterile fashion.  A 16 French pleural catheter was placed into the right pleural space using Seldinger technique. Appropriate return of air and scant serous fluid was obtained.  The tube was connected to atrium and placed on -20 cm H2O wall suction.  Complications/Tolerance None; patient tolerated the procedure well. Chest X-ray is ordered to verify placement.  EBL Minimal  Specimen(s) none   The entire procedure was supervised/proctored by Erskine Emery, MD.  Lestine Mount, PA-C [facility] 09/24/20 4:40 PM  Please see [email or web address] for pager details.

## 2020-09-24 NOTE — Progress Notes (Addendum)
Pt. O2 saturation 82-83%. Pt calm and resting, sedated not coughing or double stacking on the ventilator. Breathing unlabored. ELink notified. Will continue to monitor closely.

## 2020-09-24 NOTE — Progress Notes (Signed)
Elink called regarding pt saturation, Elink MD stated sats in the mid to upper 80's is acceptable at this time. RN aware.

## 2020-09-24 NOTE — Progress Notes (Addendum)
eLink Physician-Brief Progress Note Patient Name: Paul Branch DOB: 1956/02/20 MRN: [id number]   Date of Service  09/24/2020  HPI/Events of Note  Notified sats 80s Adequately sedated on propofol and Fentanyl. Synchronized on vent. Peak pressure 30 Fluid positive almost 3 liter past 24-48 hours On norepinephrine  eICU Interventions  Trial of Lasix 20 mg IV Informed bedside CCM to evaluate.     Intervention Category Major Interventions: Respiratory failure - evaluation and management  Judd Lien 09/24/2020, 10:16 PM

## 2020-09-24 NOTE — Progress Notes (Signed)
eLink Physician-Brief Progress Note Patient Name: Paul Branch DOB: 19-Sep-1955 MRN: [id number]   Date of Service  09/24/2020  HPI/Events of Note  Notified of hypertension 175/111 On camera assessment BP 145/91  HR 102 Patient has amlodipine 2.5 and Toprol XL 25 as home medication which has not been resumed due to episode of hypotension Seen adequately sedated  eICU Interventions  Ordered hydralazine prn     Intervention Category Major Interventions: Hypertension - evaluation and management  Judd Lien 09/24/2020, 4:41 AM

## 2020-09-24 NOTE — Progress Notes (Signed)
eLink Physician-Brief Progress Note Patient Name: Paul Branch DOB: 1955/06/14 MRN: [id number]   Date of Service  09/24/2020  HPI/Events of Note  Notified sats fluctuating 81-86% On VAC 26/530/200%/10 PEEP, peak pressure 26 CXR unchanged, slightly decreased amount of pneumothorax  eICU Interventions  O2 now 88-89% will accept this for now Continue chest tube to suction     Intervention Category Major Interventions: Respiratory failure - evaluation and management  Judd Lien 09/24/2020, 9:20 PM

## 2020-09-24 NOTE — Procedures (Signed)
Intubation Procedure Note  Paul Branch  [id number]  06-13-1955  Date:09/24/20  Time:2:43 PM   Provider Performing:Amaya Blakeman R Rashon Westrup    Procedure: Intubation (04591)  Indication(s) Respiratory Failure  Consent Unable to obtain consent due to emergent nature of procedure.   Anesthesia Fentanyl, Rocuronium and Propofol   Time Out Verified patient identification, verified procedure, site/side was marked, verified correct patient position, special equipment/implants available, medications/allergies/relevant history reviewed, required imaging and test results available.   Sterile Technique Usual hand hygeine, masks, and gloves were used   Procedure Description Patient positioned in bed supine.  Sedation given as noted above.  A bougie was placed into the ETT. Resistance was felt but able to pass distal to resistance. ETT removed over bougie and new ETT placed over bougie. Bougie was removed.  Colorimetric CO2 detector was consistent with tracheal placement.   Complications/Tolerance None; patient tolerated the procedure well. Chest X-ray is ordered to verify placement.   EBL none   Specimen(s) None

## 2020-09-24 NOTE — Progress Notes (Signed)
PCCM interval progress note:  Pt evaluated overnight with critical care attending for worsening hypoxia.  Repeat CXR post-chest tube with improved pneumothorax and chest tube is tidaling.  Pt is awake and shakes head when asked if he is experiencing chest pain.  He has been synchronous with the vent and minimal secretions with suctioning.  No wheezing on exam.   Sats were initially 78% on 100% FiO2 and PEEP 10.   POC cardiac [place] without obvious RV dilation.     -will obtain CT chest to further evaluate pneumothorax and for PE   Otilio Carpen Latarsha Zani, PA-C [facility] See Amion for pager If no response to pager , please call [phone] until 7pm After 7:00 pm call Elink  [phone]?[place]

## 2020-09-24 NOTE — Progress Notes (Signed)
Called to pt's room by RN because she was unable to pass the suction catheter down the ETT.   On my assessment, I was also unable to pass the catheter despite repositioning the ETT and attempts to lavage and suction.   Dr. Silas Flood was notified and an order for a CXR was obtained.

## 2020-09-25 ENCOUNTER — Inpatient Hospital Stay (HOSPITAL_COMMUNITY): Payer: Medicare HMO

## 2020-09-25 DIAGNOSIS — J189 Pneumonia, unspecified organism: Secondary | ICD-10-CM | POA: Diagnosis not present

## 2020-09-25 DIAGNOSIS — N179 Acute kidney failure, unspecified: Secondary | ICD-10-CM | POA: Diagnosis not present

## 2020-09-25 DIAGNOSIS — A419 Sepsis, unspecified organism: Secondary | ICD-10-CM | POA: Diagnosis not present

## 2020-09-25 DIAGNOSIS — J95811 Postprocedural pneumothorax: Secondary | ICD-10-CM | POA: Diagnosis not present

## 2020-09-25 DIAGNOSIS — J939 Pneumothorax, unspecified: Secondary | ICD-10-CM | POA: Diagnosis not present

## 2020-09-25 LAB — BASIC METABOLIC PANEL
Anion gap: 10 (ref 5–15)
BUN: 43 mg/dL — ABNORMAL HIGH (ref 8–23)
CO2: 29 mmol/L (ref 22–32)
Calcium: 8.8 mg/dL — ABNORMAL LOW (ref 8.9–10.3)
Chloride: 101 mmol/L (ref 98–111)
Creatinine, Ser: 1.49 mg/dL — ABNORMAL HIGH (ref 0.61–1.24)
GFR, Estimated: 51 mL/min — ABNORMAL LOW (ref >60)
Glucose, Bld: 112 mg/dL — ABNORMAL HIGH (ref 70–99)
Potassium: 4.7 mmol/L (ref 3.5–5.1)
Sodium: 140 mmol/L (ref 135–145)

## 2020-09-25 LAB — PHOSPHORUS
Phosphorus: 2.8 mg/dL (ref 2.5–4.6)
Phosphorus: 5 mg/dL — ABNORMAL HIGH (ref 2.5–4.6)

## 2020-09-25 LAB — CBC
HCT: 44.5 % (ref 39.0–52.0)
Hemoglobin: 13.6 g/dL (ref 13.0–17.0)
MCH: 31.1 pg (ref 26.0–34.0)
MCHC: 30.6 g/dL (ref 30.0–36.0)
MCV: 101.8 fL — ABNORMAL HIGH (ref 80.0–100.0)
Platelets: 132 10*3/uL — ABNORMAL LOW (ref 150–400)
RBC: 4.37 MIL/uL (ref 4.22–5.81)
RDW: 14 % (ref 11.5–15.5)
WBC: 16.4 10*3/uL — ABNORMAL HIGH (ref 4.0–10.5)
nRBC: 0 % (ref 0.0–0.2)

## 2020-09-25 LAB — GLUCOSE, CAPILLARY
Glucose-Capillary: 104 mg/dL — ABNORMAL HIGH (ref 70–99)
Glucose-Capillary: 116 mg/dL — ABNORMAL HIGH (ref 70–99)
Glucose-Capillary: 118 mg/dL — ABNORMAL HIGH (ref 70–99)
Glucose-Capillary: 125 mg/dL — ABNORMAL HIGH (ref 70–99)
Glucose-Capillary: 134 mg/dL — ABNORMAL HIGH (ref 70–99)
Glucose-Capillary: 148 mg/dL — ABNORMAL HIGH (ref 70–99)

## 2020-09-25 LAB — MAGNESIUM
Magnesium: 2.4 mg/dL (ref 1.7–2.4)
Magnesium: 2.6 mg/dL — ABNORMAL HIGH (ref 1.7–2.4)

## 2020-09-25 LAB — TRIGLYCERIDES: Triglycerides: 85 mg/dL (ref <150)

## 2020-09-25 LAB — CULTURE, RESPIRATORY W GRAM STAIN: Culture: NORMAL

## 2020-09-25 MED ORDER — SENNOSIDES 8.8 MG/5ML PO SYRP
5.0000 mL | ORAL_SOLUTION | Freq: Two times a day (BID) | ORAL | Status: DC
  Administered 2020-09-25 – 2020-10-05 (×19): 5 mL
  Filled 2020-09-25 (×19): qty 5

## 2020-09-25 MED ORDER — IOHEXOL 350 MG/ML SOLN
75.0000 mL | Freq: Once | INTRAVENOUS | Status: AC | PRN
  Administered 2020-09-25: 75 mL via INTRAVENOUS

## 2020-09-25 MED ORDER — LIDOCAINE HCL (PF) 1 % IJ SOLN
INTRAMUSCULAR | Status: AC
  Administered 2020-09-25: 5 mL
  Filled 2020-09-25: qty 5

## 2020-09-25 MED ORDER — LACTATED RINGERS IV BOLUS
500.0000 mL | Freq: Once | INTRAVENOUS | Status: AC
  Administered 2020-09-25: 500 mL via INTRAVENOUS

## 2020-09-25 MED ORDER — SENNOSIDES 8.8 MG/5ML PO SYRP
5.0000 mL | ORAL_SOLUTION | Freq: Two times a day (BID) | ORAL | Status: DC
  Administered 2020-09-25: 5 mL via ORAL
  Filled 2020-09-25: qty 5

## 2020-09-25 MED ORDER — LIDOCAINE HCL (PF) 1 % IJ SOLN
30.0000 mL | Freq: Once | INTRAMUSCULAR | Status: AC
Start: 2020-09-25 — End: 2020-09-25
  Filled 2020-09-25: qty 30

## 2020-09-25 NOTE — Procedures (Signed)
Insertion of Chest Tube Procedure Note  Paul Branch  [id number]  1955/09/26  Date:09/25/20  Time:2:40 AM    Provider Performing: Audria Nine   Procedure: Chest Tube Insertion [phone])  Indication(s) Pneumothorax  Consent Risks of the procedure as well as the alternatives and risks of each were explained to the patient and/or caregiver.  Consent for the procedure was obtained and is signed in the bedside chart  Anesthesia Topical only with 1% lidocaine    Time Out Verified patient identification, verified procedure, site/side was marked, verified correct patient position, special equipment/implants available, medications/allergies/relevant history reviewed, required imaging and test results available.   Sterile Technique Maximal sterile technique including full sterile barrier drape, hand hygiene, sterile gown, sterile gloves, mask, hair covering, sterile ultrasound probe cover (if used).   Procedure Description Ultrasound not used to identify appropriate pleural anatomy for placement and overlying skin marked. Area of placement cleaned and draped in sterile fashion.  A 20 French chest tube was placed into the right pleural space using Kelly dissection. Appropriate return of air was obtained.  The tube was connected to atrium and placed on -20 cm H2O wall suction.   Complications/Tolerance None; patient tolerated the procedure well. Chest X-ray is ordered to verify placement.   EBL Minimal  Specimen(s) none

## 2020-09-25 NOTE — Progress Notes (Signed)
NAME:  Paul Branch, MRN:  [id number], DOB:  [birth year], LOS: 3 ADMISSION DATE:  09/06/2020, CONSULTATION DATE:  10/04/2020 REFERRING MD:  ED CHIEF COMPLAINT:  Respiratory distress  History of Present Illness:  Paul Branch is a 65 year old gentleman with PMH significant for COPD on home Oxygen of 2 liters [place], nicotine dependency, hypertension and hyperlipidemia.  Patient presented to [facility] via EMS with worsening dysnoea.  He was found to be hypoxic with saturations of 70% on 2 liters nasal cannula Oxygen. Patient's wife reported that patient had become more short of breath. His mental status became impaired so EMS was called. The patient was initially placed on 100% FIO2 but soon afterward intubated because of hypercarbia and hypoxia. His COVID 19 and Influenza panel were negative. It is reported that patient received diuretic therapy in the ED which cause 1800 ml diuresis followed by hypotension.  Hence he was given IVF and required Levophed post intubation.  CXR is consistent with right lobar pneumonia.  Pertinent  Medical History  COPD, chronic respiratory failure with hypercapnia and hypoxemia  Significant Hospital Events:   . 4/17 admitted, intubated CAP, COPD exacerbation, CTX/Azithro . 4/18 Alert communicative, needs more sedation although not fighting vent . 4/19 Agitated, poor ventilation noted on waveforms/volumes via vent, peak pressures mid 50s, distal obstruction in ETT (unable to pass suction catheter) with high peak:plateau consistent with airway resistance, refractory hypoxemia so ETT exchanged via bougie, fu CXR with R PTX, small bore tube placed without complete re-expansion, worsening hypoxemia overnight, CTA chest with PTX, LLL consolidation, RLL atelectasis, No PE, 70F surgical CT placed, hypoxemia slowly improved, still on 100%/PEEP 10  Interim History / Subjective:  Hypoxemic, PTX no better, large bore CT placed.  Objective   Blood pressure 111/73,  pulse 84, temperature 97.9 F (36.6 C), temperature source Oral, resp. rate (!) 26, height 5\' 11"  (1.803 m), weight 92.1 kg, SpO2 (!) 85 %.    Vent Mode: PRVC FiO2 (%):  [85 %-100 %] 100 % Set Rate:  [26 bmp] 26 bmp Vt Set:  [530 mL] 530 mL PEEP:  [10 TFT73-22 cmH20] 10 cmH20 Plateau Pressure:  [24 cmH20-30 cmH20] 29 cmH20   Intake/Output Summary (Last 24 hours) at 09/25/2020 0804 Last data filed at 09/25/2020 0700 Gross per 24 hour  Intake 3143.02 ml  Output 459 ml  Net 2684.02 ml   Filed Weights   09/23/20 0340 09/24/20 0500 09/25/20 0500  Weight: 91.2 kg 90 kg 92.1 kg    Examination: General: awake, lying in bed HENT: AT, [place] Lungs: clear, coarse, ventilated sounds Cardiovascular: RRR, no murmur Abdomen: ND, BS present Extremities: no edema Neuro: alert, communicative, intubated   Labs/imaging that I havepersonally reviewed    CXR, CT, CBC, chemistries  Resolved Hospital Problem list   Septic Shock due to Pna  Assessment & Plan:  Acute on chronic hypoxic and hypercarbic respiratory failure secondary to community acquired right lobar pneumonia (CAP)  and acute exacerbation of COPD. COVID negative, influenza. MRSA PCR negative. Bad emphysema CT 2020, large shunt via Pna, will take many days to improve. Now with PTX. Grim prognosis. --f/u LRCx --CAP coverage CTX, Azithro (4/17--) --PRVC, VAP bundle --Solumedrol 40 mg IV daily  Obstructive Shock due to PTX: Component of sedation related hypotension as well --CT in place, to suction --NE, MAP > 65  AKI: Developing oliguria. ATN from hypotension, received contrast wwith CTA. --Supportive measures --LR bolus after contrast given today  Elevated troponin: Demand  ischemia -Continue ASA  H/O hypertension: BPS quite labile --hold home BP meds  H/O hyperlipidemia --continue Lipitor  Best practice (right click and "Reselect all SmartList Selections" daily)  Diet:  Tube Feed  Pain/Anxiety/Delirium protocol (if  indicated): Yes (RASS goal -2) VAP protocol (if indicated): Yes DVT prophylaxis: LMWH GI prophylaxis: PPI Glucose control:  SSI Yes Central venous access:  N/A Arterial line:  N/A Foley:  Yes, and it is still needed Mobility:  bed rest  PT consulted: N/A Last date of multidisciplinary goals of care discussion [Updated at bedside 4/19, patient indicated via writing that his son Paul Branch is the designated Media planner ] Code Status:  full code Disposition: ICU    Critical care time:    CRITICAL CARE Performed by: Lanier Clam   Total critical care time: 40 minutes  Critical care time was exclusive of separately billable procedures and treating other patients.  Critical care was necessary to treat or prevent imminent or life-threatening deterioration.  Critical care was time spent personally by me on the following activities: development of treatment plan with patient and/or surrogate as well as nursing, discussions with consultants, evaluation of patient's response to treatment, examination of patient, obtaining history from patient or surrogate, ordering and performing treatments and interventions, ordering and review of laboratory studies, ordering and review of radiographic studies, pulse oximetry and re-evaluation of patient's condition.

## 2020-09-25 NOTE — Progress Notes (Signed)
Cross covering ICU physician.   F/u on ct chest. Noted the large R ptx that is either inadequately drained with the 58fr chest tube or lung tissue is such that it will be challenging to have complete re-expansion.   There is airleak present in atrium.  Increase suction to 40% without improvement in oxygenation. He remains in low 80's.   Lengthy d/w son (1st point of contact) who stated that he wants everything done that could help his father.   I discussed the risks vs benefits of the larger chest tube and that it may not improve the ptx and hypoxia as hoped, pt's son endorsed understanding.   Mickel Baas Gleason, PA-C verified via phone.

## 2020-09-25 NOTE — Progress Notes (Addendum)
RT transported pt to and from CT, sats in the mid to low 80's in route. MD aware.

## 2020-09-26 ENCOUNTER — Inpatient Hospital Stay (HOSPITAL_COMMUNITY): Payer: Medicare HMO

## 2020-09-26 DIAGNOSIS — J189 Pneumonia, unspecified organism: Secondary | ICD-10-CM | POA: Diagnosis not present

## 2020-09-26 LAB — CBC
HCT: 44.2 % (ref 39.0–52.0)
Hemoglobin: 13.5 g/dL (ref 13.0–17.0)
MCH: 31 pg (ref 26.0–34.0)
MCHC: 30.5 g/dL (ref 30.0–36.0)
MCV: 101.6 fL — ABNORMAL HIGH (ref 80.0–100.0)
Platelets: 127 10*3/uL — ABNORMAL LOW (ref 150–400)
RBC: 4.35 MIL/uL (ref 4.22–5.81)
RDW: 14.3 % (ref 11.5–15.5)
WBC: 18 10*3/uL — ABNORMAL HIGH (ref 4.0–10.5)
nRBC: 0 % (ref 0.0–0.2)

## 2020-09-26 LAB — GLUCOSE, CAPILLARY
Glucose-Capillary: 100 mg/dL — ABNORMAL HIGH (ref 70–99)
Glucose-Capillary: 105 mg/dL — ABNORMAL HIGH (ref 70–99)
Glucose-Capillary: 110 mg/dL — ABNORMAL HIGH (ref 70–99)
Glucose-Capillary: 129 mg/dL — ABNORMAL HIGH (ref 70–99)
Glucose-Capillary: 130 mg/dL — ABNORMAL HIGH (ref 70–99)
Glucose-Capillary: 167 mg/dL — ABNORMAL HIGH (ref 70–99)

## 2020-09-26 LAB — BASIC METABOLIC PANEL
Anion gap: 8 (ref 5–15)
BUN: 51 mg/dL — ABNORMAL HIGH (ref 8–23)
CO2: 32 mmol/L (ref 22–32)
Calcium: 8.9 mg/dL (ref 8.9–10.3)
Chloride: 103 mmol/L (ref 98–111)
Creatinine, Ser: 1.59 mg/dL — ABNORMAL HIGH (ref 0.61–1.24)
GFR, Estimated: 48 mL/min — ABNORMAL LOW (ref >60)
Glucose, Bld: 121 mg/dL — ABNORMAL HIGH (ref 70–99)
Potassium: 4.2 mmol/L (ref 3.5–5.1)
Sodium: 143 mmol/L (ref 135–145)

## 2020-09-26 LAB — POCT I-STAT 7, (LYTES, BLD GAS, ICA,H+H)
Acid-Base Excess: 9 mmol/L — ABNORMAL HIGH (ref 0.0–2.0)
Bicarbonate: 39.4 mmol/L — ABNORMAL HIGH (ref 20.0–28.0)
Calcium, Ion: 1.25 mmol/L (ref 1.15–1.40)
HCT: 43 % (ref 39.0–52.0)
Hemoglobin: 14.6 g/dL (ref 13.0–17.0)
O2 Saturation: 87 %
Potassium: 4.6 mmol/L (ref 3.5–5.1)
Sodium: 141 mmol/L (ref 135–145)
TCO2: 42 mmol/L — ABNORMAL HIGH (ref 22–32)
pCO2 arterial: 82.9 mmHg (ref 32.0–48.0)
pH, Arterial: 7.286 — ABNORMAL LOW (ref 7.350–7.450)
pO2, Arterial: 63 mmHg — ABNORMAL LOW (ref 83.0–108.0)

## 2020-09-26 MED ORDER — FUROSEMIDE 10 MG/ML IJ SOLN
80.0000 mg | Freq: Once | INTRAMUSCULAR | Status: AC
  Administered 2020-09-26: 80 mg via INTRAVENOUS
  Filled 2020-09-26: qty 8

## 2020-09-26 NOTE — Progress Notes (Signed)
NAME:  Paul Branch, MRN:  [id number], DOB:  March 26, 1956, LOS: 4 ADMISSION DATE:  09/21/2020, CONSULTATION DATE:  09/23/2020 REFERRING MD:  ED CHIEF COMPLAINT:  Respiratory distress  History of Present Illness:  Paul Branch is a 65 year old gentleman with PMH significant for COPD on home Oxygen of 2 liters [place], nicotine dependency, hypertension and hyperlipidemia.  Patient presented to [facility] via EMS with worsening dysnoea.  He was found to be hypoxic with saturations of 70% on 2 liters nasal cannula Oxygen. Patient's wife reported that patient had become more short of breath. His mental status became impaired so EMS was called. The patient was initially placed on 100% FIO2 but soon afterward intubated because of hypercarbia and hypoxia. His COVID 19 and Influenza panel were negative. It is reported that patient received diuretic therapy in the ED which cause 1800 ml diuresis followed by hypotension.  Hence he was given IVF and required Levophed post intubation.  CXR is consistent with right lobar pneumonia.  Pertinent  Medical History  COPD, chronic respiratory failure with hypercapnia and hypoxemia  Significant Hospital Events:   . 4/17 admitted, intubated CAP, COPD exacerbation, CTX/Azithro . 4/18 Alert communicative, needs more sedation although not fighting vent . 4/19 Agitated, poor ventilation noted on waveforms/volumes via vent, peak pressures mid 50s, distal obstruction in ETT (unable to pass suction catheter) with high peak:plateau consistent with airway resistance, refractory hypoxemia so ETT exchanged via bougie, fu CXR with R PTX, small bore tube placed without complete re-expansion, worsening hypoxemia overnight, CTA chest with PTX, LLL consolidation, RLL atelectasis, No PE, 66F surgical CT placed, hypoxemia slowly improved, still on 100%/PEEP 10 . 4/20 Sats in 80s most of day, improved overnight to 4/21 weaning FiO2 slightly  Interim History / Subjective:  Wean O2  a bit overnight FiO2 85% from 100%. CXR/PTX improved with larger bore CT yday AM.   Objective   Blood pressure 132/76, pulse 100, temperature 97.8 F (36.6 C), temperature source Axillary, resp. rate (!) 26, height 5\' 11"  (1.803 m), weight 93 kg, SpO2 92 %.    Vent Mode: PRVC FiO2 (%):  [85 %-100 %] 85 % Set Rate:  [26 bmp] 26 bmp Vt Set:  [530 mL] 530 mL PEEP:  [10 cmH20] 10 cmH20 Plateau Pressure:  [29 cmH20-34 cmH20] 33 cmH20   Intake/Output Summary (Last 24 hours) at 09/26/2020 0809 Last data filed at 09/26/2020 0700 Gross per 24 hour  Intake 3683.32 ml  Output 1325 ml  Net 2358.32 ml   Filed Weights   09/24/20 0500 09/25/20 0500 09/26/20 0421  Weight: 90 kg 92.1 kg 93 kg    Examination: General: awake, lying in bed HENT: AT, [place] Lungs: clear, coarse, ventilated sounds Cardiovascular: RRR, no murmur Abdomen: ND, BS present Extremities: no edema Neuro: alert, communicative, intubated   Labs/imaging that I havepersonally reviewed    CXR, CT, CBC, chemistries  Resolved Hospital Problem list   Septic Shock due to Pna  Assessment & Plan:  Acute on chronic hypoxic and hypercarbic respiratory failure secondary to community acquired right lobar pneumonia (CAP)  and acute exacerbation of COPD. COVID negative, influenza. MRSA PCR negative. Bad emphysema CT 2020, large shunt via Pna, will take many days to improve. Now with PTX. Grim prognosis. --f/u LRCx --CAP coverage CTX, Azithro (4/17--) --PRVC, VAP bundle --Solumedrol 40 mg IV daily x 5 days  Obstructive Shock due to PTX: Resolved. Component of sedation related hypotension as well --CT in place, to suction,  CXR improved PTX --NE weaned off, MAP > 65  AKI: Developing oliguria. ATN from hypotension, received contrast wwith CTA. --Supportive measures  Elevated troponin: Demand ischemia -Continue ASA  H/O hypertension: BPs quite labile --hold home BP meds  H/O hyperlipidemia --continue Lipitor  Best practice  (right click and "Reselect all SmartList Selections" daily)  Diet:  Tube Feed  Pain/Anxiety/Delirium protocol (if indicated): Yes (RASS goal -2) VAP protocol (if indicated): Yes DVT prophylaxis: LMWH GI prophylaxis: PPI Glucose control:  SSI Yes Central venous access:  N/A Arterial line:  N/A Foley:  Yes, and it is still needed Mobility:  bed rest  PT consulted: N/A Last date of multidisciplinary goals of care discussion [Updated  4/20, patient  his son Daiwik [place] understands severity of illness but wants to continue current care ] Code Status:  full code Disposition: ICU    Critical care time:    CRITICAL CARE Performed by: Lanier Clam   Total critical care time: 35 minutes  Critical care time was exclusive of separately billable procedures and treating other patients.  Critical care was necessary to treat or prevent imminent or life-threatening deterioration.  Critical care was time spent personally by me on the following activities: development of treatment plan with patient and/or surrogate as well as nursing, discussions with consultants, evaluation of patient's response to treatment, examination of patient, obtaining history from patient or surrogate, ordering and performing treatments and interventions, ordering and review of laboratory studies, ordering and review of radiographic studies, pulse oximetry and re-evaluation of patient's condition.

## 2020-09-27 ENCOUNTER — Inpatient Hospital Stay (HOSPITAL_COMMUNITY): Payer: Medicare HMO

## 2020-09-27 DIAGNOSIS — J189 Pneumonia, unspecified organism: Secondary | ICD-10-CM | POA: Diagnosis not present

## 2020-09-27 LAB — BASIC METABOLIC PANEL
Anion gap: 8 (ref 5–15)
BUN: 48 mg/dL — ABNORMAL HIGH (ref 8–23)
CO2: 35 mmol/L — ABNORMAL HIGH (ref 22–32)
Calcium: 9 mg/dL (ref 8.9–10.3)
Chloride: 102 mmol/L (ref 98–111)
Creatinine, Ser: 1.14 mg/dL (ref 0.61–1.24)
GFR, Estimated: >60 mL/min (ref >60)
Glucose, Bld: 159 mg/dL — ABNORMAL HIGH (ref 70–99)
Potassium: 4.8 mmol/L (ref 3.5–5.1)
Sodium: 145 mmol/L (ref 135–145)

## 2020-09-27 LAB — GLUCOSE, CAPILLARY
Glucose-Capillary: 107 mg/dL — ABNORMAL HIGH (ref 70–99)
Glucose-Capillary: 117 mg/dL — ABNORMAL HIGH (ref 70–99)
Glucose-Capillary: 121 mg/dL — ABNORMAL HIGH (ref 70–99)
Glucose-Capillary: 141 mg/dL — ABNORMAL HIGH (ref 70–99)
Glucose-Capillary: 153 mg/dL — ABNORMAL HIGH (ref 70–99)
Glucose-Capillary: 156 mg/dL — ABNORMAL HIGH (ref 70–99)

## 2020-09-27 LAB — CULTURE, BLOOD (SINGLE)
Culture: NO GROWTH
Special Requests: ADEQUATE

## 2020-09-27 MED ORDER — VITAL AF 1.2 CAL PO LIQD
1000.0000 mL | ORAL | Status: DC
  Administered 2020-09-27 – 2020-10-01 (×4): 1000 mL
  Filled 2020-09-27 (×5): qty 1000

## 2020-09-27 MED ORDER — FREE WATER
200.0000 mL | Status: DC
  Administered 2020-09-27 – 2020-10-02 (×29): 200 mL

## 2020-09-27 MED ORDER — BISACODYL 10 MG RE SUPP
10.0000 mg | Freq: Once | RECTAL | Status: AC
  Administered 2020-09-27: 10 mg via RECTAL
  Filled 2020-09-27: qty 1

## 2020-09-27 MED ORDER — FUROSEMIDE 10 MG/ML IJ SOLN
80.0000 mg | Freq: Once | INTRAMUSCULAR | Status: AC
  Administered 2020-09-27: 80 mg via INTRAVENOUS
  Filled 2020-09-27: qty 8

## 2020-09-27 MED ORDER — DOXAZOSIN MESYLATE 2 MG PO TABS
2.0000 mg | ORAL_TABLET | Freq: Every day | ORAL | Status: AC
  Administered 2020-09-27 – 2020-09-29 (×3): 2 mg
  Filled 2020-09-27 (×3): qty 1

## 2020-09-27 MED ORDER — PROSOURCE TF PO LIQD
90.0000 mL | Freq: Three times a day (TID) | ORAL | Status: DC
  Administered 2020-09-27 – 2020-10-02 (×16): 90 mL
  Filled 2020-09-27 (×16): qty 90

## 2020-09-27 NOTE — Progress Notes (Signed)
NAME:  Jamare Vanatta, MRN:  [id number], DOB:  1956-02-08, LOS: 5 ADMISSION DATE:  10/04/2020, CONSULTATION DATE:  10/04/2020 REFERRING MD:  ED CHIEF COMPLAINT:  Respiratory distress  History of Present Illness:  Blythe L. Duquette is a 65 year old gentleman with PMH significant for COPD on home Oxygen of 2 liters [place], nicotine dependency, hypertension and hyperlipidemia.  Patient presented to [facility] via EMS with worsening dysnoea.  He was found to be hypoxic with saturations of 70% on 2 liters nasal cannula Oxygen. Patient's wife reported that patient had become more short of breath. His mental status became impaired so EMS was called. The patient was initially placed on 100% FIO2 but soon afterward intubated because of hypercarbia and hypoxia. His COVID 19 and Influenza panel were negative. It is reported that patient received diuretic therapy in the ED which cause 1800 ml diuresis followed by hypotension.  Hence he was given IVF and required Levophed post intubation.  CXR is consistent with right lobar pneumonia.  Pertinent  Medical History  COPD, chronic respiratory failure with hypercapnia and hypoxemia  Significant Hospital Events:   . 4/17 admitted, intubated CAP, COPD exacerbation, CTX/Azithro . 4/18 Alert communicative, needs more sedation although not fighting vent . 4/19 Agitated, poor ventilation noted on waveforms/volumes via vent, peak pressures mid 50s, distal obstruction in ETT (unable to pass suction catheter) with high peak:plateau consistent with airway resistance, refractory hypoxemia so ETT exchanged via bougie, fu CXR with R PTX, small bore tube placed without complete re-expansion, worsening hypoxemia overnight, CTA chest with PTX, LLL consolidation, RLL atelectasis, No PE, 70F surgical CT placed, hypoxemia slowly improved, still on 100%/PEEP 10 . 4/20 Sats in 80s most of day, improved overnight to 4/21 weaning FiO2 slightly . 4/21 Sats in low 90s on 100%  Interim  History / Subjective:  No real changes, PTX stable, persistent inferior on R  Objective   Blood pressure 137/75, pulse (!) 129, temperature 98.9 F (37.2 C), temperature source Oral, resp. rate (!) 23, height 5\' 11"  (1.803 m), weight 92.8 kg, SpO2 (!) 88 %.    Vent Mode: PRVC FiO2 (%):  [85 %-100 %] 90 % Set Rate:  [24 bmp] 24 bmp Vt Set:  [460 mL] 460 mL PEEP:  [5 cmH20-8 cmH20] 8 cmH20 Plateau Pressure:  [20 cmH20-28 cmH20] 28 cmH20   Intake/Output Summary (Last 24 hours) at 09/27/2020 1245 Last data filed at 09/27/2020 1200 Gross per 24 hour  Intake 2899.41 ml  Output 4565 ml  Net -1665.59 ml   Filed Weights   09/25/20 0500 09/26/20 0421 09/27/20 0431  Weight: 92.1 kg 93 kg 92.8 kg    Examination: General: awake, lying in bed HENT: AT, [place] Lungs: clear, coarse, ventilated sounds Cardiovascular: RRR, no murmur Abdomen: ND, BS present Extremities: no edema Neuro: alert, communicative, intubated   Labs/imaging that I havepersonally reviewed    CXR, CT, CBC, chemistries  Resolved Hospital Problem list   Septic Shock due to Pna  Assessment & Plan:  Acute on chronic hypoxic and hypercarbic respiratory failure secondary to community acquired right lobar pneumonia (CAP)  and acute exacerbation of COPD. COVID negative, influenza. MRSA PCR negative. Bad emphysema CT 2020, large shunt via Pna, will take many days to improve. Now with PTX. Grim prognosis. --f/u LRCx --CAP coverage CTX, Azithro (4/17--) --PRVC, VAP bundle --Solumedrol 40 mg IV daily x 5 days --Diurese with IV lasix  Obstructive Shock due to PTX: Resolved. Component of sedation related hypotension as  well --CT in place, to suction, CXR improved PTX persists in R base --NE weaned off, MAP > 65  AKI: Developing oliguria. ATN from hypotension, received contrast wwith CTA. Cr imrpoving --Supportive measures --Diurese  Elevated troponin: Demand ischemia -Continue ASA  H/O hypertension: BPs quite  labile --hold home BP meds  H/O hyperlipidemia --continue Lipitor  Best practice (right click and "Reselect all SmartList Selections" daily)  Diet:  Tube Feed  Pain/Anxiety/Delirium protocol (if indicated): Yes (RASS goal -2) VAP protocol (if indicated): Yes DVT prophylaxis: LMWH GI prophylaxis: PPI Glucose control:  SSI Yes Central venous access:  N/A Arterial line:  N/A Foley:  Yes, and it is still needed Mobility:  bed rest  PT consulted: N/A Last date of multidisciplinary goals of care discussion [Updated  4/22, patient  son Clearnce Leja understands severity of illness but wants to continue current care ] Code Status:  full code Disposition: ICU    Critical care time:    CRITICAL CARE Performed by: Lanier Clam   Total critical care time: 32 minutes  Critical care time was exclusive of separately billable procedures and treating other patients.  Critical care was necessary to treat or prevent imminent or life-threatening deterioration.  Critical care was time spent personally by me on the following activities: development of treatment plan with patient and/or surrogate as well as nursing, discussions with consultants, evaluation of patient's response to treatment, examination of patient, obtaining history from patient or surrogate, ordering and performing treatments and interventions, ordering and review of laboratory studies, ordering and review of radiographic studies, pulse oximetry and re-evaluation of patient's condition.

## 2020-09-27 NOTE — Plan of Care (Signed)

## 2020-09-27 NOTE — Progress Notes (Signed)
Nutrition Follow-up  DOCUMENTATION CODES:   Not applicable  INTERVENTION:   Continue tube feeding via OG tube: Decrease Vital AF 1.2 to 45 ml/h (1080 ml per day) Add Prosource TF 90 ml TID  Provides 1536 kcal (2098 kcal total with propofol), 147 gm protein, 876 ml free water daily.  NUTRITION DIAGNOSIS:   Inadequate oral intake related to inability to eat as evidenced by NPO status.  Ongoing  GOAL:   Patient will meet greater than or equal to 90% of their needs  MONITOR:   Vent status,TF tolerance,Labs  REASON FOR ASSESSMENT:   Ventilator,Consult Enteral/tube feeding initiation and management  ASSESSMENT:   65 yo male admitted with respiratory distress, R lobar PNA. PMH includes COPD on home oxygen 2L [place], HTN, HLD.   Discussed patient in ICU rounds and with RN today. Patient is tolerating TF well at goal rate. Propofol has been resumed. Will adjust TF regimen to better meet estimated needs. Large bore chest tube placed 4/20.  PTX improved on CXR 4/21.   Patient is currently intubated on ventilator support MV: 9.4 L/min Temp (24hrs), Avg:98.6 F (37 C), Min:97 F (36.1 C), Max:99 F (37.2 C)  Propofol: 21.3 ml/h provides 562 kcal from lipid.  Currently receiving Vital AF 1.2 at 80 ml/h to provide 2304 kcal, 144 gm protein, 1557 ml free water daily.  Labs reviewed.  CBG: [id number]  Medications reviewed and include colace, novolog, protonix, miralax, senokot, propofol.  I/O: +5,185 ml since admission Chest tube output: 50 ml x 24 hours UOP: 3,805 ml x 24 hours  Current weight 92.8 kg  Admission weight 91.2 kg  No BM documented this admission. Abdomen distended. Receiving Colace BID, Miralax daily, and Senokot BID.  Diet Order:   Diet Order            Diet NPO time specified  Diet effective now                 EDUCATION NEEDS:   Not appropriate for education at this time  Skin:  Skin Assessment: Reviewed RN Assessment  Last BM:  no  BM documented  Height:   Ht Readings from Last 1 Encounters:  09/16/2020 5\' 11"  (1.803 m)    Weight:   Wt Readings from Last 1 Encounters:  09/27/20 92.8 kg    BMI:  Body mass index is 28.53 kg/m.  Estimated Nutritional Needs:   Kcal:  1940  Protein:  135-155 gm  Fluid:  >/= 2.2 L    Lucas Mallow, RD, LDN, CNSC Please refer to Amion for contact information.

## 2020-09-28 ENCOUNTER — Inpatient Hospital Stay (HOSPITAL_COMMUNITY): Payer: Medicare HMO

## 2020-09-28 DIAGNOSIS — J189 Pneumonia, unspecified organism: Secondary | ICD-10-CM | POA: Diagnosis not present

## 2020-09-28 LAB — BASIC METABOLIC PANEL
Anion gap: 7 (ref 5–15)
BUN: 59 mg/dL — ABNORMAL HIGH (ref 8–23)
CO2: 42 mmol/L — ABNORMAL HIGH (ref 22–32)
Calcium: 9 mg/dL (ref 8.9–10.3)
Chloride: 96 mmol/L — ABNORMAL LOW (ref 98–111)
Creatinine, Ser: 1.21 mg/dL (ref 0.61–1.24)
GFR, Estimated: >60 mL/min (ref >60)
Glucose, Bld: 127 mg/dL — ABNORMAL HIGH (ref 70–99)
Potassium: 5.2 mmol/L — ABNORMAL HIGH (ref 3.5–5.1)
Sodium: 145 mmol/L (ref 135–145)

## 2020-09-28 LAB — POCT I-STAT 7, (LYTES, BLD GAS, ICA,H+H)
Acid-Base Excess: 14 mmol/L — ABNORMAL HIGH (ref 0.0–2.0)
Acid-Base Excess: 14 mmol/L — ABNORMAL HIGH (ref 0.0–2.0)
Bicarbonate: 44.8 mmol/L — ABNORMAL HIGH (ref 20.0–28.0)
Bicarbonate: 46 mmol/L — ABNORMAL HIGH (ref 20.0–28.0)
Calcium, Ion: 1.21 mmol/L (ref 1.15–1.40)
Calcium, Ion: 1.23 mmol/L (ref 1.15–1.40)
HCT: 45 % (ref 39.0–52.0)
HCT: 46 % (ref 39.0–52.0)
Hemoglobin: 15.3 g/dL (ref 13.0–17.0)
Hemoglobin: 15.6 g/dL (ref 13.0–17.0)
O2 Saturation: 72 %
O2 Saturation: 76 %
Patient temperature: 98.5
Patient temperature: 98.5
Potassium: 5 mmol/L (ref 3.5–5.1)
Potassium: 5.1 mmol/L (ref 3.5–5.1)
Sodium: 142 mmol/L (ref 135–145)
Sodium: 143 mmol/L (ref 135–145)
TCO2: 47 mmol/L — ABNORMAL HIGH (ref 22–32)
TCO2: 49 mmol/L — ABNORMAL HIGH (ref 22–32)
pCO2 arterial: 86 mmHg (ref 32.0–48.0)
pCO2 arterial: 95 mmHg (ref 32.0–48.0)
pH, Arterial: 7.293 — ABNORMAL LOW (ref 7.350–7.450)
pH, Arterial: 7.324 — ABNORMAL LOW (ref 7.350–7.450)
pO2, Arterial: 44 mmHg — ABNORMAL LOW (ref 83.0–108.0)
pO2, Arterial: 49 mmHg — ABNORMAL LOW (ref 83.0–108.0)

## 2020-09-28 LAB — TRIGLYCERIDES: Triglycerides: 69 mg/dL (ref <150)

## 2020-09-28 LAB — GLUCOSE, CAPILLARY
Glucose-Capillary: 107 mg/dL — ABNORMAL HIGH (ref 70–99)
Glucose-Capillary: 141 mg/dL — ABNORMAL HIGH (ref 70–99)
Glucose-Capillary: 142 mg/dL — ABNORMAL HIGH (ref 70–99)
Glucose-Capillary: 87 mg/dL (ref 70–99)
Glucose-Capillary: 90 mg/dL (ref 70–99)
Glucose-Capillary: 98 mg/dL (ref 70–99)

## 2020-09-28 LAB — MAGNESIUM: Magnesium: 2.6 mg/dL — ABNORMAL HIGH (ref 1.7–2.4)

## 2020-09-28 MED ORDER — SODIUM ZIRCONIUM CYCLOSILICATE 10 G PO PACK
10.0000 g | PACK | Freq: Every day | ORAL | Status: DC
  Administered 2020-09-28: 10 g
  Filled 2020-09-28: qty 1

## 2020-09-28 MED ORDER — BISACODYL 10 MG RE SUPP
10.0000 mg | Freq: Once | RECTAL | Status: AC
  Administered 2020-09-28: 10 mg via RECTAL
  Filled 2020-09-28: qty 1

## 2020-09-28 MED ORDER — FUROSEMIDE 10 MG/ML IJ SOLN
80.0000 mg | Freq: Four times a day (QID) | INTRAMUSCULAR | Status: AC
  Administered 2020-09-28 (×2): 80 mg via INTRAVENOUS
  Filled 2020-09-28 (×2): qty 8

## 2020-09-28 NOTE — Progress Notes (Signed)
[facility] Progress Note Patient Name: Paul Branch DOB: 1956/03/24 MRN: [id number]   Date of Service  09/28/2020  HPI/Events of Note  ABG on 100%/PRVC 24/TV 460/P 10 = 7.29/95.0/49/46.0 with Sat = 76%.   eICU Interventions  Plan: 1. Portable CXR STAT. 2. Will request that PCCM ground team evaluate the patient at bedside.      Intervention Category Major Interventions: Hypoxemia - evaluation and management;Respiratory failure - evaluation and management  Jimesha Rising [place] 09/28/2020, 1:02 AM

## 2020-09-28 NOTE — Plan of Care (Signed)

## 2020-09-28 NOTE — Progress Notes (Signed)
eLink Physician-Brief Progress Note Patient Name: Paul Branch DOB: 1956/03/18 MRN: [id number]   Date of Service  09/28/2020  HPI/Events of Note  Patient's son wanted an update on his father's condition.  eICU Interventions  I called and provided an update. I explained that patient was seriously ill from a combination of pneumonia and pneumothorax against a background of pre-existing lung disease. Patient's son seemed satisfied with the explanation and had no further questions.         Kerry Kass Paul Branch 09/28/2020, 11:40 PM

## 2020-09-28 NOTE — Progress Notes (Signed)
eLink Physician-Brief Progress Note Patient Name: Paul Branch DOB: [birth year] MRN: [id number]   Date of Service  09/28/2020  HPI/Events of Note  Patient needs morning labs ordered.  eICU Interventions  Morning labs ordered.        Frederik Pear 09/28/2020, 9:42 PM

## 2020-09-28 NOTE — Progress Notes (Signed)
NAME:  Paul Branch, MRN:  [id number], DOB:  Dec 02, 1955, LOS: 6 ADMISSION DATE:  09/11/2020, CONSULTATION DATE:  09/16/2020 REFERRING MD:  ED CHIEF COMPLAINT:  Respiratory distress  History of Present Illness:  Paul Branch is a 65 year old gentleman with PMH significant for COPD on home Oxygen of 2 liters [place], nicotine dependency, hypertension and hyperlipidemia.  Patient presented to [facility] via EMS with worsening dysnoea.  He was found to be hypoxic with saturations of 70% on 2 liters nasal cannula Oxygen. Patient's wife reported that patient had become more short of breath. His mental status became impaired so EMS was called. The patient was initially placed on 100% FIO2 but soon afterward intubated because of hypercarbia and hypoxia. His COVID 19 and Influenza panel were negative. It is reported that patient received diuretic therapy in the ED which cause 1800 ml diuresis followed by hypotension.  Hence he was given IVF and required Levophed post intubation.  CXR is consistent with right lobar pneumonia.  Pertinent  Medical History  COPD, chronic respiratory failure with hypercapnia and hypoxemia  Significant Hospital Events:   . 4/17 admitted, intubated CAP, COPD exacerbation, CTX/Azithro . 4/18 Alert communicative, needs more sedation although not fighting vent . 4/19 Agitated, poor ventilation noted on waveforms/volumes via vent, peak pressures mid 50s, distal obstruction in ETT (unable to pass suction catheter) with high peak:plateau consistent with airway resistance, refractory hypoxemia so ETT exchanged via bougie, fu CXR with R PTX, small bore tube placed without complete re-expansion, worsening hypoxemia overnight, CTA chest with PTX, LLL consolidation, RLL atelectasis, No PE, 87F surgical CT placed, hypoxemia slowly improved, still on 100%/PEEP 10 . 4/20 Sats in 80s most of day, improved overnight to 4/21 weaning FiO2 slightly . 4/21 Sats in low 90s on 100% . 4/22  unchanged  Interim History / Subjective:  Had been weaning O2, desatted to 70s, back to 100% overnight. CXR stable. O2 sats remain low 90s. Had BM.  Objective   Blood pressure 125/74, pulse (!) 109, temperature 97.6 F (36.4 C), temperature source Axillary, resp. rate (!) 24, height 5\' 11"  (1.803 m), weight 92.8 kg, SpO2 90 %.    Vent Mode: PRVC FiO2 (%):  [90 %-100 %] 100 % Set Rate:  [24 bmp] 24 bmp Vt Set:  [460 mL] 460 mL PEEP:  [8 cmH20-10 cmH20] 10 cmH20 Plateau Pressure:  [22 cmH20-28 cmH20] 22 cmH20   Intake/Output Summary (Last 24 hours) at 09/28/2020 7622 Last data filed at 09/28/2020 0700 Gross per 24 hour  Intake 3206.95 ml  Output 4438 ml  Net -1231.05 ml   Filed Weights   09/26/20 0421 09/27/20 0431 09/28/20 0403  Weight: 93 kg 92.8 kg 92.8 kg    Examination: General: awake, lying in bed HENT: AT, [place] Lungs: clear, coarse, ventilated sounds Cardiovascular: RRR, no murmur Abdomen: mildly distended, BS present Extremities: no edema Neuro:sedated, intubated   Labs/imaging that I havepersonally reviewed    [facility] Problem list   Septic Shock due to Pna Elevated troponin due to demand ischemia  Assessment & Plan:  Acute on chronic hypoxic and hypercarbic respiratory failure secondary to community acquired right lobar pneumonia (CAP)  and acute exacerbation of COPD. COVID negative, influenza. MRSA PCR negative. Bad emphysema CT 2020, large shunt via Pna, will take many days to improve. Now with PTX. Grim prognosis. --f/u LRCx --s/p CAP coverage CTX, Azithro (4/17--4/22) --PRVC, VAP bundle --s/p Solumedrol 40 mg IV daily x 5 days --  Continue to diurese with IV lasix  Obstructive Shock due to PTX: Resolved. Component of sedation related hypotension as well --CT in place, to suction, CXR improved PTX persists in R base --NE weaned off, MAP > 65  AKI: Developing oliguria. ATN from hypotension, received contrast wwith CTA. Cr  improving, stable. --Supportive measures --Diurese  H/O hypertension: BPs quite labile --hold home BP meds  H/O hyperlipidemia --continue Lipitor  Best practice (right click and "Reselect all SmartList Selections" daily)  Diet:  Tube Feed  Pain/Anxiety/Delirium protocol (if indicated): Yes (RASS goal -2) VAP protocol (if indicated): Yes DVT prophylaxis: LMWH GI prophylaxis: PPI Glucose control:  SSI Yes Central venous access:  N/A Arterial line:  N/A Foley:  Yes, and it is still needed Mobility:  bed rest  PT consulted: N/A Last date of multidisciplinary goals of care discussion [Updated  4/22, patient  son Paul Branch understands severity of illness but wants to continue current care ] Code Status:  full code Disposition: ICU    Critical care time:    CRITICAL CARE Performed by: Lanier Clam   Total critical care time: 35 minutes  Critical care time was exclusive of separately billable procedures and treating other patients.  Critical care was necessary to treat or prevent imminent or life-threatening deterioration.  Critical care was time spent personally by me on the following activities: development of treatment plan with patient and/or surrogate as well as nursing, discussions with consultants, evaluation of patient's response to treatment, examination of patient, obtaining history from patient or surrogate, ordering and performing treatments and interventions, ordering and review of laboratory studies, ordering and review of radiographic studies, pulse oximetry and re-evaluation of patient's condition.

## 2020-09-29 ENCOUNTER — Inpatient Hospital Stay (HOSPITAL_COMMUNITY): Payer: Medicare HMO

## 2020-09-29 DIAGNOSIS — J189 Pneumonia, unspecified organism: Secondary | ICD-10-CM | POA: Diagnosis not present

## 2020-09-29 LAB — BASIC METABOLIC PANEL
Anion gap: 7 (ref 5–15)
BUN: 68 mg/dL — ABNORMAL HIGH (ref 8–23)
CO2: 42 mmol/L — ABNORMAL HIGH (ref 22–32)
Calcium: 9.1 mg/dL (ref 8.9–10.3)
Chloride: 94 mmol/L — ABNORMAL LOW (ref 98–111)
Creatinine, Ser: 1.06 mg/dL (ref 0.61–1.24)
GFR, Estimated: >60 mL/min (ref >60)
Glucose, Bld: 139 mg/dL — ABNORMAL HIGH (ref 70–99)
Potassium: 4.7 mmol/L (ref 3.5–5.1)
Sodium: 143 mmol/L (ref 135–145)

## 2020-09-29 LAB — CBC WITH DIFFERENTIAL/PLATELET
Abs Immature Granulocytes: 0.14 10*3/uL — ABNORMAL HIGH (ref 0.00–0.07)
Basophils Absolute: 0 10*3/uL (ref 0.0–0.1)
Basophils Relative: 0 %
Eosinophils Absolute: 0 10*3/uL (ref 0.0–0.5)
Eosinophils Relative: 0 %
HCT: 45.7 % (ref 39.0–52.0)
Hemoglobin: 13 g/dL (ref 13.0–17.0)
Immature Granulocytes: 1 %
Lymphocytes Relative: 3 %
Lymphs Abs: 0.5 10*3/uL — ABNORMAL LOW (ref 0.7–4.0)
MCH: 31 pg (ref 26.0–34.0)
MCHC: 28.4 g/dL — ABNORMAL LOW (ref 30.0–36.0)
MCV: 108.8 fL — ABNORMAL HIGH (ref 80.0–100.0)
Monocytes Absolute: 0.9 10*3/uL (ref 0.1–1.0)
Monocytes Relative: 6 %
Neutro Abs: 14.2 10*3/uL — ABNORMAL HIGH (ref 1.7–7.7)
Neutrophils Relative %: 90 %
Platelets: 130 10*3/uL — ABNORMAL LOW (ref 150–400)
RBC: 4.2 MIL/uL — ABNORMAL LOW (ref 4.22–5.81)
RDW: 14.6 % (ref 11.5–15.5)
WBC: 15.8 10*3/uL — ABNORMAL HIGH (ref 4.0–10.5)
nRBC: 0 % (ref 0.0–0.2)

## 2020-09-29 LAB — GLUCOSE, CAPILLARY
Glucose-Capillary: 113 mg/dL — ABNORMAL HIGH (ref 70–99)
Glucose-Capillary: 132 mg/dL — ABNORMAL HIGH (ref 70–99)
Glucose-Capillary: 132 mg/dL — ABNORMAL HIGH (ref 70–99)
Glucose-Capillary: 140 mg/dL — ABNORMAL HIGH (ref 70–99)
Glucose-Capillary: 151 mg/dL — ABNORMAL HIGH (ref 70–99)
Glucose-Capillary: 151 mg/dL — ABNORMAL HIGH (ref 70–99)

## 2020-09-29 LAB — MAGNESIUM: Magnesium: 2.5 mg/dL — ABNORMAL HIGH (ref 1.7–2.4)

## 2020-09-29 LAB — TRIGLYCERIDES: Triglycerides: 207 mg/dL — ABNORMAL HIGH (ref <150)

## 2020-09-29 MED ORDER — IPRATROPIUM-ALBUTEROL 0.5-2.5 (3) MG/3ML IN SOLN
3.0000 mL | Freq: Three times a day (TID) | RESPIRATORY_TRACT | Status: DC
  Administered 2020-09-30 – 2020-10-05 (×15): 3 mL via RESPIRATORY_TRACT
  Filled 2020-09-29 (×15): qty 3

## 2020-09-29 MED ORDER — INSULIN ASPART 100 UNIT/ML ~~LOC~~ SOLN
0.0000 [IU] | SUBCUTANEOUS | Status: DC
  Administered 2020-09-29 (×2): 1 [IU] via SUBCUTANEOUS
  Administered 2020-09-29 (×2): 2 [IU] via SUBCUTANEOUS
  Administered 2020-09-30 – 2020-10-01 (×7): 1 [IU] via SUBCUTANEOUS
  Administered 2020-10-01: 2 [IU] via SUBCUTANEOUS
  Administered 2020-10-01 (×2): 1 [IU] via SUBCUTANEOUS
  Administered 2020-10-02: 2 [IU] via SUBCUTANEOUS
  Administered 2020-10-02 (×3): 1 [IU] via SUBCUTANEOUS
  Administered 2020-10-02: 2 [IU] via SUBCUTANEOUS
  Administered 2020-10-03: 1 [IU] via SUBCUTANEOUS
  Administered 2020-10-03 (×2): 2 [IU] via SUBCUTANEOUS
  Administered 2020-10-03 (×2): 1 [IU] via SUBCUTANEOUS
  Administered 2020-10-03: 2 [IU] via SUBCUTANEOUS
  Administered 2020-10-04 (×6): 1 [IU] via SUBCUTANEOUS
  Administered 2020-10-05: 2 [IU] via SUBCUTANEOUS
  Administered 2020-10-05: 1 [IU] via SUBCUTANEOUS
  Administered 2020-10-05: 2 [IU] via SUBCUTANEOUS

## 2020-09-29 MED ORDER — FUROSEMIDE 10 MG/ML IJ SOLN
80.0000 mg | Freq: Four times a day (QID) | INTRAMUSCULAR | Status: AC
  Administered 2020-09-29 (×2): 80 mg via INTRAVENOUS
  Filled 2020-09-29 (×2): qty 8

## 2020-09-29 NOTE — Progress Notes (Signed)
NAME:  Paul Branch, MRN:  [id number], DOB:  [birth year], LOS: 7 ADMISSION DATE:  09/10/2020, CONSULTATION DATE:  09/27/2020 REFERRING MD:  ED CHIEF COMPLAINT:  Respiratory distress  History of Present Illness:  Paul Branch is a 65 year old gentleman with PMH significant for COPD on home Oxygen of 2 liters [place], nicotine dependency, hypertension and hyperlipidemia.  Patient presented to [facility] via EMS with worsening dysnoea.  He was found to be hypoxic with saturations of 70% on 2 liters nasal cannula Oxygen. Patient's wife reported that patient had become more short of breath. His mental status became impaired so EMS was called. The patient was initially placed on 100% FIO2 but soon afterward intubated because of hypercarbia and hypoxia. His COVID 19 and Influenza panel were negative. It is reported that patient received diuretic therapy in the ED which cause 1800 ml diuresis followed by hypotension.  Hence he was given IVF and required Levophed post intubation.  CXR is consistent with right lobar pneumonia.  Pertinent  Medical History  COPD, chronic respiratory failure with hypercapnia and hypoxemia  Significant Hospital Events:   . 4/17 admitted, intubated CAP, COPD exacerbation, CTX/Azithro . 4/18 Alert communicative, needs more sedation although not fighting vent . 4/19 Agitated, poor ventilation noted on waveforms/volumes via vent, peak pressures mid 50s, distal obstruction in ETT (unable to pass suction catheter) with high peak:plateau consistent with airway resistance, refractory hypoxemia so ETT exchanged via bougie, fu CXR with R PTX, small bore tube placed without complete re-expansion, worsening hypoxemia overnight, CTA chest with PTX, LLL consolidation, RLL atelectasis, No PE, 56F surgical CT placed, hypoxemia slowly improved, still on 100%/PEEP 10 . 4/20 Sats in 80s most of day, improved overnight to 4/21 weaning FiO2 slightly . 4/21 Sats in low 90s on 100% . 4/22  unchanged, diuresed . 4/23 unchanged, diuresed . 4/24 Sats mildly better, PTX appears nearly resolved which is improved, continue diuresis  Interim History / Subjective:  BMs better. O2 a bit better. PTX looks nearly resolved.  Objective   Blood pressure (!) 158/82, pulse (!) 122, temperature 98.2 F (36.8 C), temperature source Axillary, resp. rate (!) 32, height 5\' 11"  (1.803 m), weight 89.9 kg, SpO2 97 %.    Vent Mode: PRVC FiO2 (%):  [90 %-100 %] 90 % Set Rate:  [24 bmp] 24 bmp Vt Set:  [460 mL] 460 mL PEEP:  [10 cmH20] 10 cmH20 Plateau Pressure:  [24 cmH20-26 cmH20] 25 cmH20   Intake/Output Summary (Last 24 hours) at 09/29/2020 0906 Last data filed at 09/29/2020 5053 Gross per 24 hour  Intake 2806.05 ml  Output 4660 ml  Net -1853.95 ml   Filed Weights   09/27/20 0431 09/28/20 0403 09/29/20 0458  Weight: 92.8 kg 92.8 kg 89.9 kg    Examination: General: awake, lying in bed HENT: AT, [place] Lungs: clear, coarse, ventilated sounds Cardiovascular: RRR, no murmur Abdomen: mildly distended, BS present Extremities: no edema Neuro:sedated, intubated   Labs/imaging that I havepersonally reviewed    [facility] Problem list   Septic Shock due to Pna Elevated troponin due to demand ischemia  Assessment & Plan:  Acute on chronic hypoxic and hypercarbic respiratory failure secondary to community acquired right lobar pneumonia (CAP)  and acute exacerbation of COPD. COVID negative, influenza. MRSA PCR negative. Bad emphysema CT 2020, large shunt via Pna, will take many days to improve. Now with PTX. Grim prognosis. -LRCx w/ OP flora --s/p CAP coverage CTX, Azithro (4/17--4/22) --PRVC,  VAP bundle --s/p Solumedrol 40 mg IV daily x 5 days --Continue to diurese with IV lasix  Obstructive Shock due to PTX: Resolved. Component of sedation related hypotension as well --CT in place, to suction,  PTX improving --NE weaned off, MAP > 65  AKI: Developing  oliguria. ATN from hypotension, received contrast wwith CTA. Cr improving, stable. --Supportive measures --Diurese  H/O hypertension: BPs quite labile --hold home BP meds  H/O hyperlipidemia --continue Lipitor  Best practice (right click and "Reselect all SmartList Selections" daily)  Diet:  Tube Feed  Pain/Anxiety/Delirium protocol (if indicated): Yes (RASS goal -2) VAP protocol (if indicated): Yes DVT prophylaxis: LMWH GI prophylaxis: PPI Glucose control:  SSI Yes Central venous access:  N/A Arterial line:  N/A Foley:  Yes, and it is still needed Mobility:  bed rest  PT consulted: N/A Last date of multidisciplinary goals of care discussion [Updated  4/23, patient  son Paul Branch understands severity of illness but wants to continue current care ] Code Status:  full code Disposition: ICU    Critical care time:    CRITICAL CARE Performed by: Lanier Clam   Total critical care time: 32 minutes  Critical care time was exclusive of separately billable procedures and treating other patients.  Critical care was necessary to treat or prevent imminent or life-threatening deterioration.  Critical care was time spent personally by me on the following activities: development of treatment plan with patient and/or surrogate as well as nursing, discussions with consultants, evaluation of patient's response to treatment, examination of patient, obtaining history from patient or surrogate, ordering and performing treatments and interventions, ordering and review of laboratory studies, ordering and review of radiographic studies, pulse oximetry and re-evaluation of patient's condition.

## 2020-09-30 ENCOUNTER — Inpatient Hospital Stay (HOSPITAL_COMMUNITY): Payer: Medicare HMO

## 2020-09-30 DIAGNOSIS — J189 Pneumonia, unspecified organism: Secondary | ICD-10-CM | POA: Diagnosis not present

## 2020-09-30 DIAGNOSIS — J939 Pneumothorax, unspecified: Secondary | ICD-10-CM | POA: Diagnosis not present

## 2020-09-30 DIAGNOSIS — J9601 Acute respiratory failure with hypoxia: Secondary | ICD-10-CM

## 2020-09-30 LAB — CBC WITH DIFFERENTIAL/PLATELET
Abs Immature Granulocytes: 0.1 10*3/uL — ABNORMAL HIGH (ref 0.00–0.07)
Basophils Absolute: 0 10*3/uL (ref 0.0–0.1)
Basophils Relative: 0 %
Eosinophils Absolute: 0 10*3/uL (ref 0.0–0.5)
Eosinophils Relative: 0 %
HCT: 47.4 % (ref 39.0–52.0)
Hemoglobin: 13.2 g/dL (ref 13.0–17.0)
Immature Granulocytes: 1 %
Lymphocytes Relative: 2 %
Lymphs Abs: 0.3 10*3/uL — ABNORMAL LOW (ref 0.7–4.0)
MCH: 30.6 pg (ref 26.0–34.0)
MCHC: 27.8 g/dL — ABNORMAL LOW (ref 30.0–36.0)
MCV: 109.7 fL — ABNORMAL HIGH (ref 80.0–100.0)
Monocytes Absolute: 0.7 10*3/uL (ref 0.1–1.0)
Monocytes Relative: 5 %
Neutro Abs: 14.4 10*3/uL — ABNORMAL HIGH (ref 1.7–7.7)
Neutrophils Relative %: 92 %
Platelets: 141 10*3/uL — ABNORMAL LOW (ref 150–400)
RBC: 4.32 MIL/uL (ref 4.22–5.81)
RDW: 14.5 % (ref 11.5–15.5)
WBC: 15.5 10*3/uL — ABNORMAL HIGH (ref 4.0–10.5)
nRBC: 0 % (ref 0.0–0.2)

## 2020-09-30 LAB — BASIC METABOLIC PANEL
Anion gap: 4 — ABNORMAL LOW (ref 5–15)
BUN: 75 mg/dL — ABNORMAL HIGH (ref 8–23)
CO2: 50 mmol/L — ABNORMAL HIGH (ref 22–32)
Calcium: 9.2 mg/dL (ref 8.9–10.3)
Chloride: 94 mmol/L — ABNORMAL LOW (ref 98–111)
Creatinine, Ser: 1.04 mg/dL (ref 0.61–1.24)
GFR, Estimated: >60 mL/min (ref >60)
Glucose, Bld: 132 mg/dL — ABNORMAL HIGH (ref 70–99)
Potassium: 4.4 mmol/L (ref 3.5–5.1)
Sodium: 148 mmol/L — ABNORMAL HIGH (ref 135–145)

## 2020-09-30 LAB — POCT I-STAT 7, (LYTES, BLD GAS, ICA,H+H)
Acid-Base Excess: 22 mmol/L — ABNORMAL HIGH (ref 0.0–2.0)
Bicarbonate: 54 mmol/L — ABNORMAL HIGH (ref 20.0–28.0)
Calcium, Ion: 1.23 mmol/L (ref 1.15–1.40)
HCT: 44 % (ref 39.0–52.0)
Hemoglobin: 15 g/dL (ref 13.0–17.0)
O2 Saturation: 93 %
Potassium: 3.9 mmol/L (ref 3.5–5.1)
Sodium: 145 mmol/L (ref 135–145)
TCO2: >50 mmol/L — ABNORMAL HIGH (ref 22–32)
pCO2 arterial: 95.7 mmHg (ref 32.0–48.0)
pH, Arterial: 7.36 (ref 7.350–7.450)
pO2, Arterial: 76 mmHg — ABNORMAL LOW (ref 83.0–108.0)

## 2020-09-30 LAB — TRIGLYCERIDES: Triglycerides: 148 mg/dL (ref <150)

## 2020-09-30 LAB — TSH: TSH: 1.497 u[IU]/mL (ref 0.350–4.500)

## 2020-09-30 LAB — GLUCOSE, CAPILLARY
Glucose-Capillary: 110 mg/dL — ABNORMAL HIGH (ref 70–99)
Glucose-Capillary: 119 mg/dL — ABNORMAL HIGH (ref 70–99)
Glucose-Capillary: 134 mg/dL — ABNORMAL HIGH (ref 70–99)
Glucose-Capillary: 137 mg/dL — ABNORMAL HIGH (ref 70–99)
Glucose-Capillary: 146 mg/dL — ABNORMAL HIGH (ref 70–99)
Glucose-Capillary: 129 mg/dL — ABNORMAL HIGH (ref 70–99)

## 2020-09-30 MED ORDER — BUDESONIDE 0.5 MG/2ML IN SUSP
0.5000 mg | Freq: Two times a day (BID) | RESPIRATORY_TRACT | Status: DC
  Administered 2020-09-30 – 2020-10-05 (×10): 0.5 mg via RESPIRATORY_TRACT
  Filled 2020-09-30 (×10): qty 2

## 2020-09-30 MED ORDER — NOREPINEPHRINE 4 MG/250ML-% IV SOLN
INTRAVENOUS | Status: AC
  Filled 2020-09-30: qty 250

## 2020-09-30 MED ORDER — ADULT MULTIVITAMIN LIQUID CH
15.0000 mL | Freq: Every day | ORAL | Status: DC
  Administered 2020-09-30 – 2020-10-05 (×6): 15 mL
  Filled 2020-09-30 (×6): qty 15

## 2020-09-30 MED ORDER — DEXMEDETOMIDINE HCL IN NACL 400 MCG/100ML IV SOLN
0.0000 ug/kg/h | INTRAVENOUS | Status: DC
  Administered 2020-09-30: 0.4 ug/kg/h via INTRAVENOUS
  Administered 2020-09-30: 1.2 ug/kg/h via INTRAVENOUS
  Administered 2020-09-30: 1.1 ug/kg/h via INTRAVENOUS
  Administered 2020-10-01: 1.2 ug/kg/h via INTRAVENOUS
  Administered 2020-10-01: 0.6 ug/kg/h via INTRAVENOUS
  Administered 2020-10-01: 1.2 ug/kg/h via INTRAVENOUS
  Filled 2020-09-30 (×4): qty 100
  Filled 2020-09-30: qty 200
  Filled 2020-09-30: qty 100

## 2020-09-30 MED ORDER — ACETAZOLAMIDE 250 MG PO TABS
500.0000 mg | ORAL_TABLET | Freq: Once | ORAL | Status: AC
  Administered 2020-09-30: 500 mg
  Filled 2020-09-30: qty 2

## 2020-09-30 NOTE — Progress Notes (Signed)
eLink Physician-Brief Progress Note Patient Name: Artrell Lawless DOB: 06-07-1956 MRN: [id number]   Date of Service  09/30/2020  HPI/Events of Note  Patient needs CBC with diff ordered for the a.m.  eICU Interventions  CBC with diff ordered.     Intervention Category Minor Interventions: Clinical assessment - ordering diagnostic tests  Frederik Pear 09/30/2020, 1:31 AM

## 2020-09-30 NOTE — Progress Notes (Addendum)
NAME:  Paul Branch, MRN:  [id number], DOB:  Nov 25, 1955, LOS: 8 ADMISSION DATE:  09/10/2020, CONSULTATION DATE:  09/24/2020 REFERRING MD:  ED CHIEF COMPLAINT:  Respiratory distress  History of Present Illness:  Paul Branch is a 65 year old gentleman with PMH significant for COPD on 2L home oxygen, nicotine dependency, hypertension and hyperlipidemia who presented to [facility] 4/17 via EMS with worsening dyspnea. The patient's wife reported that he had become more short of breath & mental status became impaired so EMS was called. He was found to be hypoxic with saturations of 70% on 2 liters nasal cannula Oxygen. The patient was initially placed on 100% FIO2 but soon afterward intubated because of hypercarbia and hypoxia. His COVID 19 and Influenza panel were negative. It is reported that patient received diuretic therapy in the ED with 1800 ml diuresis followed by hypotension.  Hence he was given IVF and required Levophed post intubation.  CXR is consistent with right lobar pneumonia. He was transferred to [place] for further care.  On 4/19, he was noted to have changes on mechanical ventilation worrisome for ETT obstruction, ETT was exchanged. Post exchange film with pneumothorax requiring chest tube placement.   Pertinent  Medical History  COPD, chronic respiratory failure with hypercapnia and hypoxemia  Significant Hospital Events:   . 4/17 admitted, intubated CAP, COPD exacerbation, CTX/Azithro . 4/18 Alert communicative, needs more sedation although not fighting vent . 4/19 Agitated, poor ventilation noted on waveforms/volumes via vent, peak pressures mid 50s, distal obstruction in ETT (unable to pass suction catheter) with high peak:plateau consistent with airway resistance, refractory hypoxemia so ETT exchanged via bougie, fu CXR with R PTX, small bore tube placed without complete re-expansion, worsening hypoxemia overnight, CTA chest with PTX, LLL consolidation, RLL atelectasis,  No PE, 70F surgical CT placed, hypoxemia slowly improved, still on 100%/PEEP 10 . 4/20 Sats in 80s most of day, improved overnight to 4/21 weaning FiO2 slightly . 4/21 Sats in low 90s on 100% . 4/22 unchanged, diuresed . 4/23 unchanged, diuresed . 4/24 Sats mildly better, PTX appears nearly resolved which is improved, continue diuresis. BM's better.  Interim History / Subjective:  Afebrile / WBC 15.5  / all micro negative  Vent - 60% fiO2, PEEP 10 Glucose range 110-132  I/O 4.8L UOP, 100 ml stool, -1.7L in last 24 hours  RN reports sedation increased for bath as patient was hypertensive > on propofol 25 mcg, fentanyl 150 mcg  Objective   Blood pressure (!) 142/93, pulse (!) 112, temperature 98.6 F (37 C), temperature source Oral, resp. rate (!) 28, height 5\' 11"  (1.803 m), weight 87.1 kg, SpO2 96 %.    Vent Mode: PRVC FiO2 (%):  [60 %-90 %] 60 % Set Rate:  [24 bmp] 24 bmp Vt Set:  [460 mL] 460 mL PEEP:  [10 cmH20] 10 cmH20 Plateau Pressure:  [22 cmH20-31 cmH20] 29 cmH20   Intake/Output Summary (Last 24 hours) at 09/30/2020 0802 Last data filed at 09/30/2020 0700 Gross per 24 hour  Intake 3122.15 ml  Output 5010 ml  Net -1887.85 ml   Filed Weights   09/28/20 0403 09/29/20 0458 09/30/20 0330  Weight: 92.8 kg 89.9 kg 87.1 kg    Examination: General: elderly male, ill appearing lying in bed on vent in NAD    HEENT: MM pink/moist, ETT Neuro: opens eyes to voice, sedated  CV: s1s2 RRR, ST on monitor, no m/r/g PULM: non-labored on vent, on 6 cc/kg / rate 24,  lungs bilaterally coarse with soft wheeze. Right chest tube in place on -40cm suction with 1/7 air leak GI: soft, bsx4 active  Extremities: warm/dry, trace UE dependent edema, improved edema on lowers  Skin: no rashes or lesions   Resolved Hospital Problem list   Septic Shock due to PNA Elevated troponin due to demand ischemia Obstructive Shock due to PTX  Assessment & Plan:   Acute on Chronic Hypoxic and  Hypercarbic Respiratory Failure secondary to Community Acquired Right Lobar Pneumonia (CAP, NOS) and AECOPD.  Right Pneumothorax  COVID, influenza, MRSA PCR negative. Radiographic review of CT shows significant underlying emphysema, large shunt via PNA, will take many days to improve. Developed ETT obstruction 4/19, exchanged.  Post film showed PTX on R, s/p surgical chest tube placement. Tracheal aspirate negative. Treated with CTX, Azithro from 4/17 - 4/22. S/p 5 days IV steroids.  -PRVC 6cc/kg, rate 24  -follow up ABG 4/25  -D9 on vent, discussed with family re: concept of trach  -daily SBT / WUA  -follow CXR daily with CT in place  -reduce suction to -20 cm, follow up CXR at 1500 -saturation goal 88-94% -chest tube care per protocol  -VAP prevention measures  -hold lasix 4/25, reassess am 4/26 for intermittent dosing -acetazolamide x1   AKI  Hypernatremia  ATN from hypotension, received contrast with CTA. Cr improving, stable. BUN likely elevated from steroids and diuresis.   -Trend BMP / urinary output -Replace electrolytes as indicated -Avoid nephrotoxic agents, ensure adequate renal perfusion -hold lasix 4/25 as above  -continue free water PT   Macrocytosis  Thrombocytopenia  -follow CBC, monitor for bleeding  -MVI PT  At Risk Malnutrition  -TF per Nutrition   Hx Hypertension  -hold home antihypertensives   Hx Hyperlipidemia -continue lipitor   Best practice (right click and "Reselect all SmartList Selections" daily)  Diet:  Tube Feed  Pain/Anxiety/Delirium protocol (if indicated): Yes (RASS goal -2) VAP protocol (if indicated): Yes DVT prophylaxis: LMWH GI prophylaxis: PPI Glucose control:  SSI Yes Central venous access:  N/A Arterial line:  N/A Foley:  Yes, and it is still needed Mobility:  bed rest  PT consulted: N/A Last date of multidisciplinary goals of care discussion - updated son 4/25 via phone on plan of care.  Discussed slow progress, but not yet  at point of liberation from ventilation & may require tracheostomy.  We reviewed the concept of trach and he will discuss with family.  Not yet warranted but may need if unable to wean.   Code Status:  full code Disposition: ICU        CRITICAL CARE Performed by: Noe Gens, NP-C, AGACNP-BC   Total critical care time: 34 minutes  Critical care time was exclusive of separately billable procedures and treating other patients.  Critical care was necessary to treat or prevent imminent or life-threatening deterioration.  Critical care was time spent personally by me on the following activities: development of treatment plan with patient and/or surrogate as well as nursing, discussions with consultants, evaluation of patient's response to treatment, examination of patient, obtaining history from patient or surrogate, ordering and performing treatments and interventions, ordering and review of laboratory studies, ordering and review of radiographic studies, pulse oximetry and re-evaluation of patient's condition.   Noe Gens, MSN, APRN, NP-C, AGACNP-BC [facility] 09/30/2020, 8:11 AM   Please see [email or web address] for pager details.   From 7A-7P if no response, please call [phone] After hours, please call ELink [phone]

## 2020-10-01 ENCOUNTER — Inpatient Hospital Stay (HOSPITAL_COMMUNITY): Payer: Medicare HMO

## 2020-10-01 DIAGNOSIS — J939 Pneumothorax, unspecified: Secondary | ICD-10-CM | POA: Diagnosis not present

## 2020-10-01 DIAGNOSIS — J189 Pneumonia, unspecified organism: Secondary | ICD-10-CM | POA: Diagnosis not present

## 2020-10-01 DIAGNOSIS — J9601 Acute respiratory failure with hypoxia: Secondary | ICD-10-CM | POA: Diagnosis not present

## 2020-10-01 LAB — COMPREHENSIVE METABOLIC PANEL
ALT: 74 U/L — ABNORMAL HIGH (ref 0–44)
AST: 55 U/L — ABNORMAL HIGH (ref 15–41)
Albumin: 2.5 g/dL — ABNORMAL LOW (ref 3.5–5.0)
Alkaline Phosphatase: 75 U/L (ref 38–126)
Anion gap: 7 (ref 5–15)
BUN: 69 mg/dL — ABNORMAL HIGH (ref 8–23)
CO2: 43 mmol/L — ABNORMAL HIGH (ref 22–32)
Calcium: 9 mg/dL (ref 8.9–10.3)
Chloride: 99 mmol/L (ref 98–111)
Creatinine, Ser: 1.07 mg/dL (ref 0.61–1.24)
GFR, Estimated: >60 mL/min (ref >60)
Glucose, Bld: 137 mg/dL — ABNORMAL HIGH (ref 70–99)
Potassium: 4.3 mmol/L (ref 3.5–5.1)
Sodium: 149 mmol/L — ABNORMAL HIGH (ref 135–145)
Total Bilirubin: 0.6 mg/dL (ref 0.3–1.2)
Total Protein: 6 g/dL — ABNORMAL LOW (ref 6.5–8.1)

## 2020-10-01 LAB — GLUCOSE, CAPILLARY
Glucose-Capillary: 118 mg/dL — ABNORMAL HIGH (ref 70–99)
Glucose-Capillary: 127 mg/dL — ABNORMAL HIGH (ref 70–99)
Glucose-Capillary: 140 mg/dL — ABNORMAL HIGH (ref 70–99)
Glucose-Capillary: 145 mg/dL — ABNORMAL HIGH (ref 70–99)
Glucose-Capillary: 148 mg/dL — ABNORMAL HIGH (ref 70–99)
Glucose-Capillary: 159 mg/dL — ABNORMAL HIGH (ref 70–99)

## 2020-10-01 LAB — CBC
HCT: 49 % (ref 39.0–52.0)
Hemoglobin: 13.8 g/dL (ref 13.0–17.0)
MCH: 31 pg (ref 26.0–34.0)
MCHC: 28.2 g/dL — ABNORMAL LOW (ref 30.0–36.0)
MCV: 110.1 fL — ABNORMAL HIGH (ref 80.0–100.0)
Platelets: 148 10*3/uL — ABNORMAL LOW (ref 150–400)
RBC: 4.45 MIL/uL (ref 4.22–5.81)
RDW: 14.7 % (ref 11.5–15.5)
WBC: 15.5 10*3/uL — ABNORMAL HIGH (ref 4.0–10.5)
nRBC: 0 % (ref 0.0–0.2)

## 2020-10-01 LAB — POCT I-STAT 7, (LYTES, BLD GAS, ICA,H+H)
Acid-Base Excess: 19 mmol/L — ABNORMAL HIGH (ref 0.0–2.0)
Bicarbonate: 49.9 mmol/L — ABNORMAL HIGH (ref 20.0–28.0)
Calcium, Ion: 1.28 mmol/L (ref 1.15–1.40)
HCT: 42 % (ref 39.0–52.0)
Hemoglobin: 14.3 g/dL (ref 13.0–17.0)
O2 Saturation: 87 %
Potassium: 3.9 mmol/L (ref 3.5–5.1)
Sodium: 149 mmol/L — ABNORMAL HIGH (ref 135–145)
TCO2: >50 mmol/L — ABNORMAL HIGH (ref 22–32)
pCO2 arterial: 88 mmHg (ref 32.0–48.0)
pH, Arterial: 7.362 (ref 7.350–7.450)
pO2, Arterial: 60 mmHg — ABNORMAL LOW (ref 83.0–108.0)

## 2020-10-01 LAB — TRIGLYCERIDES: Triglycerides: 120 mg/dL (ref <150)

## 2020-10-01 LAB — LACTIC ACID, PLASMA: Lactic Acid, Venous: 1 mmol/L (ref 0.5–1.9)

## 2020-10-01 MED ORDER — SODIUM CHLORIDE 0.9 % IV SOLN: 250.0000 mL | INTRAVENOUS | Status: DC

## 2020-10-01 MED ORDER — NOREPINEPHRINE 4 MG/250ML-% IV SOLN
2.0000 ug/min | INTRAVENOUS | Status: DC
  Administered 2020-10-02: 3 ug/min via INTRAVENOUS
  Administered 2020-10-03: 1.5 ug/min via INTRAVENOUS
  Administered 2020-10-04: 3.5 ug/min via INTRAVENOUS
  Filled 2020-10-01 (×2): qty 250

## 2020-10-01 MED ORDER — ACETAZOLAMIDE 250 MG PO TABS
500.0000 mg | ORAL_TABLET | Freq: Once | ORAL | Status: AC
  Administered 2020-10-01: 500 mg via ORAL
  Filled 2020-10-01: qty 2

## 2020-10-01 MED ORDER — FUROSEMIDE 10 MG/ML IJ SOLN
40.0000 mg | Freq: Once | INTRAMUSCULAR | Status: AC
  Administered 2020-10-01: 40 mg via INTRAVENOUS
  Filled 2020-10-01: qty 4

## 2020-10-01 NOTE — Progress Notes (Addendum)
NAME:  Paul Branch, MRN:  [id number], DOB:  [birth year], LOS: 9 ADMISSION DATE:  09/25/2020, CONSULTATION DATE:  09/06/2020 REFERRING MD:  ED CHIEF COMPLAINT:  Respiratory distress  History of Present Illness:  Paul Branch is a 65 year old gentleman with PMH significant for COPD on 2L home oxygen, nicotine dependency, hypertension and hyperlipidemia who presented to [facility] 4/17 via EMS with worsening dyspnea. The patient's wife reported that he had become more short of breath & mental status became impaired so EMS was called. He was found to be hypoxic with saturations of 70% on 2 liters nasal cannula Oxygen. The patient was initially placed on 100% FIO2 but soon afterward intubated because of hypercarbia and hypoxia. His COVID 19 and Influenza panel were negative. It is reported that patient received diuretic therapy in the ED with 1800 ml diuresis followed by hypotension.  Hence he was given IVF and required Levophed post intubation.  CXR is consistent with right lobar pneumonia. He was transferred to [facility] for further care.  On 4/19, he was noted to have changes on mechanical ventilation worrisome for ETT obstruction, ETT was exchanged. Post exchange film with pneumothorax requiring chest tube placement.   Pertinent  Medical History  COPD, chronic respiratory failure with hypercapnia and hypoxemia  Significant Hospital Events:   . 4/17 admitted, intubated CAP, COPD exacerbation, CTX/Azithro . 4/18 Alert communicative, needs more sedation although not fighting vent . 4/19 Agitated, poor ventilation noted on waveforms/volumes via vent, peak pressures mid 50s, distal obstruction in ETT (unable to pass suction catheter) with high peak:plateau consistent with airway resistance, refractory hypoxemia so ETT exchanged via bougie, fu CXR with R PTX, small bore tube placed without complete re-expansion, worsening hypoxemia overnight, CTA chest with PTX, LLL consolidation, RLL atelectasis,  No PE, 30F surgical CT placed, hypoxemia slowly improved, still on 100%/PEEP 10 . 4/20 Sats in 80s most of day, improved overnight to 4/21 weaning FiO2 slightly . 4/21 Sats in low 90s on 100% . 4/22 unchanged, diuresed . 4/23 unchanged, diuresed . 4/24 Sats mildly better, PTX appears nearly resolved which is improved, continue diuresis. BM's better. . 4/25: Had to increase FiO2 to 100% overnight for sat drops, now back to 85%, BP labile on Precedex, Good diuresis  Interim History / Subjective:  T max 100.2 WBC is 15.5, HGB 13 Vent - 85%  fiO2, PEEP 5, had to increase overnight to 100% for a short time  Glucose range 146-119  I/O 3.0 L UOP, 400 ml stool, - 183  in last 24 hours  Off propofol, now on precedex BP labile Order for levophed 4/26 early am for soft BP, but never started ALT 55, AST 74, Na 149  Lactate 1.0 No ABG  Objective   Blood pressure (!) 83/57, pulse (!) 103, temperature 98.8 F (37.1 C), temperature source Oral, resp. rate (!) 27, height 5\' 11"  (1.803 m), weight 84.2 kg, SpO2 90 %.    Vent Mode: PRVC FiO2 (%):  [60 %-100 %] 60 % Set Rate:  [22 bmp-24 bmp] 22 bmp Vt Set:  [460 mL] 460 mL PEEP:  [5 cmH20-10 cmH20] 5 cmH20 Plateau Pressure:  [20 cmH20-29 cmH20] 23 cmH20   Intake/Output Summary (Last 24 hours) at 10/01/2020 0832 Last data filed at 10/01/2020 0800 Gross per 24 hour  Intake 3259.69 ml  Output 3150 ml  Net 109.69 ml   Filed Weights   09/29/20 0458 09/30/20 0330 10/01/20 0347  Weight: 89.9 kg 87.1 kg 84.2  kg    Examination: General: elderly male, ill appearing lying in bed on vent sedated  in NAD    HEENT: NCAT, MM pink/moist, ETT secure Neuro: opens eyes to voice, sedated  CV: s1s2 RRR, ST on monitor, no m/r/g PULM: Bilateral chest excursion, overbreathing vent ,  on 6 cc/kg / rate 34, lungs bilaterally coarse with soft wheeze. Right chest tube in place on -20cm suction with 1/7 air leak GI: soft, bsx4 active  Extremities: warm/dry, 1+ UE  dependent edema, improved edema on lowers  Skin: no rashes or lesions   Resolved Hospital Problem list   Septic Shock due to PNA Elevated troponin due to demand ischemia Obstructive Shock due to PTX  Assessment & Plan:   Acute on Chronic Hypoxic and Hypercarbic Respiratory Failure secondary to Community Acquired Right Lobar Pneumonia (CAP, NOS) and AECOPD.  Right Pneumothorax  COVID, influenza, MRSA PCR negative. Radiographic review of CT shows significant underlying emphysema, large shunt via PNA, will take many days to improve. Developed ETT obstruction 4/19, exchanged.  Post film showed PTX on R, s/p surgical chest tube placement. Tracheal aspirate negative. Treated with CTX, Azithro from 4/17 - 4/22. S/p 5 days IV steroids.  -PRVC 6cc/kg, rate 24  -follow up ABG 4/26  -D10 on vent, Previous discussion  with family re: concept of trach  -daily SBT / WUA as able  -follow CXR daily with CT in place  -reduce suction to water seal 4/26 - CXR in 6 hours ( 1600) -saturation goal 88-94% -chest tube care per protocol  -VAP prevention measures  -assess daily for intermittent diuresis -acetazolamide x1 4/25 with good reponse  AKI  Hypernatremia  ATN from hypotension, received contrast with CTA. Cr improving, stable. BUN likely elevated from steroids and diuresis.   -Trend BMP / urinary output -Replace electrolytes as indicated -Avoid nephrotoxic agents, ensure adequate renal perfusion -Consider diuretic daily -continue free water PT   Pulmonary Nodule L Upper Lobe Increased in size from 1.8 cm to 2.6 cm  Pt is high risk current every day smoker  Concerning for malignancy Plan Consider bronchoscopy for tissue sampling May need to be considered in goals of care conversations  Elevated LFT -Trend -Consider US abdomen  Macrocytosis  Thrombocytopenia  -follow CBC, monitor for bleeding  -MVI PT  Low Grade Fever 4/26 Plan - Trend WBC and Fever Curve - Culture as is  clinically indicated   At Risk Malnutrition  -TF per Nutrition   Hx Hypertension , but BP currently labile ? precedex related -hold home antihypertensives   Hx Hyperlipidemia -continue lipitor   Best practice (right click and "Reselect all SmartList Selections" daily)  Diet:  Tube Feed  Pain/Anxiety/Delirium protocol (if indicated): Yes (RASS goal -2) VAP protocol (if indicated): Yes DVT prophylaxis: LMWH GI prophylaxis: PPI Glucose control:  SSI Yes Central venous access:  N/A Arterial line:  N/A Foley:  Yes, and it is still needed Mobility:  bed rest  PT consulted: N/A Last date of multidisciplinary goals of care discussion - updated son 4/25 via phone on plan of care.  Discussed slow progress, but not yet at point of liberation from ventilation & may require tracheostomy.  We reviewed the concept of trach and he will discuss with family.  Not yet warranted but may need if unable to wean.   Code Status:  full code Disposition: ICU        CRITICAL CARE Performed by: [name], NP-C, AGACNP-BC   Total critical care time:32  minutes  Critical care time was exclusive of separately billable procedures and treating other patients.  Critical care was necessary to treat or prevent imminent or life-threatening deterioration.  Critical care was time spent personally by me on the following activities: development of treatment plan with patient and/or surrogate as well as nursing, discussions with consultants, evaluation of patient's response to treatment, examination of patient, obtaining history from patient or surrogate, ordering and performing treatments and interventions, ordering and review of laboratory studies, ordering and review of radiographic studies, pulse oximetry and re-evaluation of patient's condition.  [name], MSN, AGACNP-BC [facility] 10/01/2020, 8:32 AM   Please see [email or web address] for pager details.   From 7A-7P if no  response, please call [phone] Hospital Use only, no outpatient use. After hours, please call ELink [phone]

## 2020-10-01 NOTE — Progress Notes (Addendum)
eLink Physician-Brief Progress Note Patient Name: Paul Branch DOB: [birth year] MRN: [id number]   Date of Service  10/01/2020  HPI/Events of Note  Bedside RN concerned about patient's soft blood pressure although urine output remains good and his serum sodium has normalized. BP has been as low as 78/54.  eICU Interventions  Will start patient on peripheral Norepinephrine, in order to avoid giving him additional fluids, given his tenuous respiratory status. Will  Also check a lactic acid to screen for lingering shock. Bedside RN also requested to gradually lighten sedation targeting a RAS of 0 to -2 without ventilator dyssynchrony.        Kerry Kass Buster Schueller 10/01/2020, 2:40 AM

## 2020-10-02 ENCOUNTER — Inpatient Hospital Stay (HOSPITAL_COMMUNITY): Payer: Medicare HMO

## 2020-10-02 DIAGNOSIS — J9601 Acute respiratory failure with hypoxia: Secondary | ICD-10-CM | POA: Diagnosis not present

## 2020-10-02 DIAGNOSIS — J189 Pneumonia, unspecified organism: Secondary | ICD-10-CM | POA: Diagnosis not present

## 2020-10-02 DIAGNOSIS — J939 Pneumothorax, unspecified: Secondary | ICD-10-CM | POA: Diagnosis not present

## 2020-10-02 LAB — BLOOD GAS, ARTERIAL
Acid-Base Excess: 17.7 mmol/L — ABNORMAL HIGH (ref 0.0–2.0)
Bicarbonate: 46 mmol/L — ABNORMAL HIGH (ref 20.0–28.0)
FIO2: 100
O2 Saturation: 90.5 %
Patient temperature: 37
pCO2 arterial: 120 mmHg (ref 32.0–48.0)
pH, Arterial: 7.209 — ABNORMAL LOW (ref 7.350–7.450)
pO2, Arterial: 69.6 mmHg — ABNORMAL LOW (ref 83.0–108.0)

## 2020-10-02 LAB — GLUCOSE, CAPILLARY
Glucose-Capillary: 124 mg/dL — ABNORMAL HIGH (ref 70–99)
Glucose-Capillary: 136 mg/dL — ABNORMAL HIGH (ref 70–99)
Glucose-Capillary: 145 mg/dL — ABNORMAL HIGH (ref 70–99)
Glucose-Capillary: 161 mg/dL — ABNORMAL HIGH (ref 70–99)
Glucose-Capillary: 176 mg/dL — ABNORMAL HIGH (ref 70–99)
Glucose-Capillary: 178 mg/dL — ABNORMAL HIGH (ref 70–99)

## 2020-10-02 LAB — POCT I-STAT 7, (LYTES, BLD GAS, ICA,H+H)
Acid-Base Excess: 15 mmol/L — ABNORMAL HIGH (ref 0.0–2.0)
Bicarbonate: 48.8 mmol/L — ABNORMAL HIGH (ref 20.0–28.0)
Calcium, Ion: 1.28 mmol/L (ref 1.15–1.40)
HCT: 48 % (ref 39.0–52.0)
Hemoglobin: 16.3 g/dL (ref 13.0–17.0)
O2 Saturation: 95 %
Patient temperature: 99
Potassium: 3.8 mmol/L (ref 3.5–5.1)
Sodium: 151 mmol/L — ABNORMAL HIGH (ref 135–145)
TCO2: >50 mmol/L — ABNORMAL HIGH (ref 22–32)
pCO2 arterial: 107.8 mmHg (ref 32.0–48.0)
pH, Arterial: 7.265 — ABNORMAL LOW (ref 7.350–7.450)
pO2, Arterial: 95 mmHg (ref 83.0–108.0)

## 2020-10-02 LAB — COMPREHENSIVE METABOLIC PANEL
ALT: 60 U/L — ABNORMAL HIGH (ref 0–44)
AST: 42 U/L — ABNORMAL HIGH (ref 15–41)
Albumin: 2.4 g/dL — ABNORMAL LOW (ref 3.5–5.0)
Alkaline Phosphatase: 77 U/L (ref 38–126)
Anion gap: 11 (ref 5–15)
BUN: 78 mg/dL — ABNORMAL HIGH (ref 8–23)
CO2: 40 mmol/L — ABNORMAL HIGH (ref 22–32)
Calcium: 9.5 mg/dL (ref 8.9–10.3)
Chloride: 101 mmol/L (ref 98–111)
Creatinine, Ser: 1.12 mg/dL (ref 0.61–1.24)
GFR, Estimated: >60 mL/min (ref >60)
Glucose, Bld: 157 mg/dL — ABNORMAL HIGH (ref 70–99)
Potassium: 4.6 mmol/L (ref 3.5–5.1)
Sodium: 152 mmol/L — ABNORMAL HIGH (ref 135–145)
Total Bilirubin: 0.6 mg/dL (ref 0.3–1.2)
Total Protein: 6 g/dL — ABNORMAL LOW (ref 6.5–8.1)

## 2020-10-02 LAB — BRAIN NATRIURETIC PEPTIDE: B Natriuretic Peptide: 570.9 pg/mL — ABNORMAL HIGH (ref 0.0–100.0)

## 2020-10-02 LAB — CBC WITH DIFFERENTIAL/PLATELET
Abs Immature Granulocytes: 0.12 10*3/uL — ABNORMAL HIGH (ref 0.00–0.07)
Basophils Absolute: 0 10*3/uL (ref 0.0–0.1)
Basophils Relative: 0 %
Eosinophils Absolute: 0 10*3/uL (ref 0.0–0.5)
Eosinophils Relative: 0 %
HCT: 53.3 % — ABNORMAL HIGH (ref 39.0–52.0)
Hemoglobin: 14.6 g/dL (ref 13.0–17.0)
Immature Granulocytes: 1 %
Lymphocytes Relative: 3 %
Lymphs Abs: 0.4 10*3/uL — ABNORMAL LOW (ref 0.7–4.0)
MCH: 31.3 pg (ref 26.0–34.0)
MCHC: 27.4 g/dL — ABNORMAL LOW (ref 30.0–36.0)
MCV: 114.1 fL — ABNORMAL HIGH (ref 80.0–100.0)
Monocytes Absolute: 0.8 10*3/uL (ref 0.1–1.0)
Monocytes Relative: 6 %
Neutro Abs: 13.3 10*3/uL — ABNORMAL HIGH (ref 1.7–7.7)
Neutrophils Relative %: 90 %
Platelets: 154 10*3/uL (ref 150–400)
RBC: 4.67 MIL/uL (ref 4.22–5.81)
RDW: 14.6 % (ref 11.5–15.5)
WBC: 14.7 10*3/uL — ABNORMAL HIGH (ref 4.0–10.5)
nRBC: 0 % (ref 0.0–0.2)

## 2020-10-02 LAB — CBC
HCT: 54.6 % — ABNORMAL HIGH (ref 39.0–52.0)
Hemoglobin: 14.7 g/dL (ref 13.0–17.0)
MCH: 30.8 pg (ref 26.0–34.0)
MCHC: 26.9 g/dL — ABNORMAL LOW (ref 30.0–36.0)
MCV: 114.2 fL — ABNORMAL HIGH (ref 80.0–100.0)
Platelets: 167 10*3/uL (ref 150–400)
RBC: 4.78 MIL/uL (ref 4.22–5.81)
RDW: 14.6 % (ref 11.5–15.5)
WBC: 14.8 10*3/uL — ABNORMAL HIGH (ref 4.0–10.5)
nRBC: 0 % (ref 0.0–0.2)

## 2020-10-02 LAB — PROTIME-INR
INR: 1 (ref 0.8–1.2)
Prothrombin Time: 13.4 seconds (ref 11.4–15.2)

## 2020-10-02 LAB — D-DIMER, QUANTITATIVE: D-Dimer, Quant: 15.82 ug/mL-FEU — ABNORMAL HIGH (ref 0.00–0.50)

## 2020-10-02 LAB — MAGNESIUM: Magnesium: 2.8 mg/dL — ABNORMAL HIGH (ref 1.7–2.4)

## 2020-10-02 MED ORDER — FUROSEMIDE 10 MG/ML IJ SOLN: 40.0000 mg | Freq: Once | INTRAMUSCULAR | Status: DC

## 2020-10-02 MED ORDER — VITAL AF 1.2 CAL PO LIQD
1000.0000 mL | ORAL | Status: DC
  Administered 2020-10-02 – 2020-10-05 (×5): 1000 mL
  Filled 2020-10-02 (×3): qty 1000

## 2020-10-02 MED ORDER — FUROSEMIDE 10 MG/ML IJ SOLN
40.0000 mg | Freq: Two times a day (BID) | INTRAMUSCULAR | Status: DC
  Administered 2020-10-02 – 2020-10-03 (×3): 40 mg via INTRAVENOUS
  Filled 2020-10-02 (×3): qty 4

## 2020-10-02 MED ORDER — FREE WATER
300.0000 mL | Status: DC
  Administered 2020-10-02 – 2020-10-03 (×7): 300 mL

## 2020-10-02 MED ORDER — ACETAZOLAMIDE 250 MG PO TABS
500.0000 mg | ORAL_TABLET | Freq: Once | ORAL | Status: AC
  Administered 2020-10-02: 500 mg via ORAL
  Filled 2020-10-02: qty 2

## 2020-10-02 MED ORDER — PROSOURCE TF PO LIQD
45.0000 mL | Freq: Every day | ORAL | Status: DC
  Administered 2020-10-03 – 2020-10-05 (×3): 45 mL
  Filled 2020-10-02 (×3): qty 45

## 2020-10-02 NOTE — Progress Notes (Signed)
NAME:  Paul Branch, MRN:  [id number], DOB:  10/26/1955, LOS: 10 ADMISSION DATE:  09/21/2020, CONSULTATION DATE:  09/29/2020 REFERRING MD:  ED CHIEF COMPLAINT:  Respiratory distress  History of Present Illness:  Paul Branch is a 65 year old gentleman with PMH significant for COPD on 2L home oxygen, nicotine dependency, hypertension and hyperlipidemia who presented to [facility] 4/17 via EMS with worsening dyspnea. The patient's wife reported that he had become more short of breath & mental status became impaired so EMS was called. He was found to be hypoxic with saturations of 70% on 2 liters nasal cannula Oxygen. The patient was initially placed on 100% FIO2 but soon afterward intubated because of hypercarbia and hypoxia. His COVID 19 and Influenza panel were negative. It is reported that patient received diuretic therapy in the ED with 1800 ml diuresis followed by hypotension.  Hence he was given IVF and required Levophed post intubation.  CXR is consistent with right lobar pneumonia. He was transferred to [facility] for further care.  On 4/19, he was noted to have changes on mechanical ventilation worrisome for ETT obstruction, ETT was exchanged. Post exchange film with pneumothorax requiring chest tube placement.   Pertinent  Medical History  COPD, chronic respiratory failure with hypercapnia and hypoxemia  Significant Hospital Events:   . 4/17 admitted, intubated CAP, COPD exacerbation, CTX/Azithro . 4/18 Alert communicative, needs more sedation although not fighting vent . 4/19 Agitated, poor ventilation noted on waveforms/volumes via vent, peak pressures mid 50s, distal obstruction in ETT (unable to pass suction catheter) with high peak:plateau consistent with airway resistance, refractory hypoxemia so ETT exchanged via bougie, fu CXR with R PTX, small bore tube placed without complete re-expansion, worsening hypoxemia overnight, CTA chest with PTX, LLL consolidation, RLL atelectasis,  No PE, 96F surgical CT placed, hypoxemia slowly improved, still on 100%/PEEP 10 . 4/20 Sats in 80s most of day, improved overnight to 4/21 weaning FiO2 slightly . 4/21 Sats in low 90s on 100% . 4/22 unchanged, diuresed . 4/23 unchanged, diuresed . 4/24 Sats mildly better, PTX appears nearly resolved which is improved, continue diuresis. BM's better. . 4/25: Had to increase FiO2 to 100% overnight for sat drops, now back to 85%, BP labile on Precedex, Good diuresis . Chest tube was placed under waterseal, repeat x-ray chest showed a stable very small pneumothorax, remains on high vent setting with rising PCO2  Interim History / Subjective:  Overnight patient is started desaturating, ventilatory setting has to be adjusted 200% FiO2 and PEEP was increased from 5-10, repeat ABG showed worsening PCO2 with pH 7.2 X-ray chest was repeated showed minimal residual right-sided pneumothorax  Objective   Blood pressure 103/69, pulse (!) 114, temperature 99.2 F (37.3 C), temperature source Axillary, resp. rate (!) 26, height 5\' 11"  (1.803 m), weight 84.3 kg, SpO2 95 %.    Vent Mode: PRVC FiO2 (%):  [80 %-100 %] 90 % Set Rate:  [22 bmp-26 bmp] 26 bmp Vt Set:  [460 mL] 460 mL PEEP:  [5 cmH20-14 cmH20] 10 cmH20 Plateau Pressure:  [21 cmH20-27 cmH20] 21 cmH20   Intake/Output Summary (Last 24 hours) at 10/02/2020 0946 Last data filed at 10/02/2020 0800 Gross per 24 hour  Intake 2362.24 ml  Output 3557 ml  Net -1194.76 ml   Filed Weights   09/30/20 0330 10/01/20 0347 10/02/20 0500  Weight: 87.1 kg 84.2 kg 84.3 kg    Examination: General: Elderly African-American male, acute on chronically ill, lying in the  bed, orally intubated HEENT: NCAT, MM pink/moist, ETT secure Neuro: Eyes closed, does not open, not following commands CV: Tachycardic, regular rhythm, no murmur or gallop PULM: Reduced air entry all over, no wheezes or rhonchi.  Right-sided large bore chest tube in place under water seal with  1/7 air leak GI: soft, bsx4 active  Extremities: warm/dry, 1+ UE dependent edema, improved edema on lowers  Skin: no rashes or lesions   Resolved Hospital Problem list   Septic Shock due to PNA Elevated troponin due to demand ischemia Obstructive Shock due to PTX  Assessment & Plan:   Acute on Chronic Hypoxic and Hypercarbic Respiratory Failure secondary to Community Acquired Right Lobar Pneumonia (CAP, NOS) and acute COPD exacerbation Right Pneumothorax  Acute respiratory acidosis CT shows significant underlying emphysema, large shunt via PNA Developed ETT obstruction 4/19, exchanged.  Post film showed PTX on R, s/p surgical chest tube placement. Tracheal aspirate negative. Treated with CTX, Azithro from 4/17 - 4/22. S/p 5 continue lung protective ventilation, vent settings were adjusted with increasing respiratory rate to clear hypercapnia and acute respiratory acidosis Repeat ABG in an hour Continue chest tube under waterseal Repeat x-ray chest in the morning VAP prevention measures  Continue diuresis with Lasix and acetazolamide  AKI  Hypernatremia  ATN from hypotension, received contrast with CTA. Cr improving, stable. BUN likely elevated from steroids and diuresis.   Trend BMP / urinary output Replace electrolytes as indicated Serum sodium is trending up due to aggressive diuresis Continue free water flushes Avoid nephrotoxic agents, ensure adequate renal perfusion  Pulmonary Nodule L Upper Lobe Increased in size from 1.8 cm to 2.6 cm  Pt is high risk current every day smoker  Concerning for malignancy Consider bronchoscopy for tissue sampling Spoke with patient's son, asked to have family meeting regarding goals of care discussion considering he is not improving with end-stage COPD and with possible malignancy   Macrocytosis  Thrombocytopenia  Stable, monitor CBC  Hypertension , but BP currently labile -hold home antihypertensives   Hyperlipidemia -continue  lipitor   Best practice (right click and "Reselect all SmartList Selections" daily)  Diet:  Tube Feed  Pain/Anxiety/Delirium protocol (if indicated): Yes (RASS goal -2) VAP protocol (if indicated): Yes DVT prophylaxis: LMWH GI prophylaxis: PPI Glucose control:  SSI Yes Central venous access:  N/A Arterial line:  N/A Foley: Yes but discontinue today Mobility:  bed rest  PT consulted: N/A Last date of multidisciplinary goals of care discussion - updated son 4/27 via phone on plan of care.  Discussed to have family meeting later today or tomorrow as patient has not been improving and now with probable lung malignancy Code Status:  full code Disposition: ICU        Total critical care time: 38 minutes  Performed by: [place] care time was exclusive of separately billable procedures and treating other patients.   Critical care was necessary to treat or prevent imminent or life-threatening deterioration.   Critical care was time spent personally by me on the following activities: development of treatment plan with patient and/or surrogate as well as nursing, discussions with consultants, evaluation of patient's response to treatment, examination of patient, obtaining history from patient or surrogate, ordering and performing treatments and interventions, ordering and review of laboratory studies, ordering and review of radiographic studies, pulse oximetry and re-evaluation of patient's condition.   Jacky Kindle MD  Pulmonary Critical Care See Amion for pager If no response to pager, please call [phone] until  7pm After 7pm, Please call E-link [phone]

## 2020-10-02 NOTE — Progress Notes (Addendum)
Nutrition Follow-up  DOCUMENTATION CODES:   Not applicable  INTERVENTION:   Continue tube feeding via OG tube: Vital AF 1.2 increase to 75 ml/h (1800 ml per day) Prosource TF decrease to 45 ml once daily  Provides 2200 kcal, 146 gm protein, 1460 ml free water daily.  With current free water flushes 300 ml every 4 hours, total intake is 3260 ml free water daily.  NUTRITION DIAGNOSIS:   Inadequate oral intake related to inability to eat as evidenced by NPO status.  Ongoing  GOAL:   Patient will meet greater than or equal to 90% of their needs  MONITOR:   Vent status,TF tolerance,Labs  REASON FOR ASSESSMENT:   Ventilator,Consult Enteral/tube feeding initiation and management  ASSESSMENT:   65 yo male admitted with respiratory distress, R lobar PNA. PMH includes COPD on home oxygen 2L [place], HTN, HLD.   Discussed patient in ICU rounds and with RN today. Patient is tolerating TF well at goal rate. Off all sedation. Volume overloaded. Plans for family meeting soon to discuss goals of care as patient is not improving. Large bore chest tube in place to water seal. Propofol off. Will adjust TF regimen to better meet estimated needs.   Patient remains intubated on ventilator support MV: 13 L/min Temp (24hrs), Avg:99.9 F (37.7 C), Min:98.7 F (37.1 C), Max:101 F (38.3 C)   Currently receiving Vital AF 1.2 at 45 ml/h with Prosource 90 ml TID to provide 1536 kcal, 147 gm protein, 876 ml free water daily. Free water flushes 300 ml every 4 hours for a total of 2676 ml free water daily.  Labs reviewed. Na 151, mag 2.8 CBG: [id number]  Medications reviewed and include colace, lasix, novolog, liquid MVI, protonix, miralax, senokot.  I/O: -1 L since admission Chest tube output: 70 ml x 24 hours UOP: 3,512 ml x 24 hours Stool: 100 ml output x 24 hours  Current weight 84.3 kg  Admission weight 91.2 kg  Diet Order:   Diet Order            Diet NPO time specified   Diet effective now                 EDUCATION NEEDS:   Not appropriate for education at this time  Skin:  Skin Assessment: Reviewed RN Assessment  Last BM:  4/27 rectal tube  Height:   Ht Readings from Last 1 Encounters:  09/11/2020 5\' 11"  (1.803 m)    Weight:   Wt Readings from Last 1 Encounters:  10/02/20 84.3 kg    BMI:  Body mass index is 25.92 kg/m.  Estimated Nutritional Needs:   Kcal:  2170  Protein:  135-155 gm  Fluid:  >/= 2.2 L    Lucas Mallow, RD, LDN, CNSC Please refer to Amion for contact information.

## 2020-10-02 NOTE — Progress Notes (Signed)
Patient's SpO2 down to 78% on 100% FiO2. Respiratory called to the room and started bagging. Patient's O2 only came up to 90%. SBP dropped down to 60's. Norepinephrine started. Respiratory increased PEEP to 14, and ELink MD ordered STAT Chest X-Ray and ABG. Awaiting orders pending results. Will continue to monitor closely.

## 2020-10-02 NOTE — Progress Notes (Addendum)
eLink Physician-Brief Progress Note Patient Name: Johnpaul Gillentine DOB: [birth year] MRN: [id number]   Date of Service  10/02/2020  HPI/Events of Note  Stat Camera eval for low BP and sats < 70. Rty bagged him and upped PEEP to 14. BG 132. Started on Levophed. Had some blood from ET while bagging. Hg 14.6  Labs, notes reviewed On Lovenox 40 sq. plt ok.  MAP up > 70 now post levophed. HR 120's. sats still 89.P peak 28. In synchrony  460/14/22/100%.  Diminished air entry on both lungs. Rt chest tube: still has air leak.   Severe COPD/s/p CAP-treated. Not on abx now./Rt Ptx-chest tube/AKI/acute on chr type 2 failure/LUL nodule concerns for malignancy.Marland Kitchen   eICU Interventions  Stat CxR/ABG and to adjust Vent setting accordingly -get PT/INR/CBC for any DAH.  Overall prognosis poor.       Intervention Category Major Interventions: Respiratory failure - evaluation and management  Elmer Sow 10/02/2020, 3:59 AM   4:13 AM CxR film seen and reviewed with old. - no new pneumo, rt chest tube in place.improving bilateral mid zone, peri hilar interstitial densities. No effusion. Tubes in place.   Cr stable at 1.12, hypernatremia, LFT ok.  - follow BNP, d dimer. And ABG. Get procalcitonin, if levated consider empiric HCAP coverage?.  4:40 AM Discussed  With RN : pco2 120. sats now 95%. On lovenox as VTE. No further ET bleeding. - wean PEEP from 14 to 12, if sats over 94%, wean further to 10.  - look in to Tube feeding to avoid hypercarbia;

## 2020-10-03 ENCOUNTER — Inpatient Hospital Stay (HOSPITAL_COMMUNITY): Payer: Medicare HMO

## 2020-10-03 LAB — GLUCOSE, CAPILLARY
Glucose-Capillary: 147 mg/dL — ABNORMAL HIGH (ref 70–99)
Glucose-Capillary: 172 mg/dL — ABNORMAL HIGH (ref 70–99)
Glucose-Capillary: 128 mg/dL — ABNORMAL HIGH (ref 70–99)
Glucose-Capillary: 138 mg/dL — ABNORMAL HIGH (ref 70–99)
Glucose-Capillary: 156 mg/dL — ABNORMAL HIGH (ref 70–99)
Glucose-Capillary: 143 mg/dL — ABNORMAL HIGH (ref 70–99)

## 2020-10-03 LAB — COMPREHENSIVE METABOLIC PANEL
ALT: 53 U/L — ABNORMAL HIGH (ref 0–44)
AST: 31 U/L (ref 15–41)
Albumin: 2.3 g/dL — ABNORMAL LOW (ref 3.5–5.0)
Alkaline Phosphatase: 70 U/L (ref 38–126)
Anion gap: 6 (ref 5–15)
BUN: 96 mg/dL — ABNORMAL HIGH (ref 8–23)
CO2: 46 mmol/L — ABNORMAL HIGH (ref 22–32)
Calcium: 9.1 mg/dL (ref 8.9–10.3)
Chloride: 100 mmol/L (ref 98–111)
Creatinine, Ser: 1.27 mg/dL — ABNORMAL HIGH (ref 0.61–1.24)
GFR, Estimated: >60 mL/min (ref >60)
Glucose, Bld: 149 mg/dL — ABNORMAL HIGH (ref 70–99)
Potassium: 4.1 mmol/L (ref 3.5–5.1)
Sodium: 152 mmol/L — ABNORMAL HIGH (ref 135–145)
Total Bilirubin: 0.5 mg/dL (ref 0.3–1.2)
Total Protein: 5.8 g/dL — ABNORMAL LOW (ref 6.5–8.1)

## 2020-10-03 LAB — POCT I-STAT 7, (LYTES, BLD GAS, ICA,H+H)
Acid-Base Excess: 19 mmol/L — ABNORMAL HIGH (ref 0.0–2.0)
Bicarbonate: 50.6 mmol/L — ABNORMAL HIGH (ref 20.0–28.0)
Calcium, Ion: 1.26 mmol/L (ref 1.15–1.40)
HCT: 42 % (ref 39.0–52.0)
Hemoglobin: 14.3 g/dL (ref 13.0–17.0)
O2 Saturation: 96 %
Patient temperature: 99.9
Potassium: 3.9 mmol/L (ref 3.5–5.1)
Sodium: 151 mmol/L — ABNORMAL HIGH (ref 135–145)
TCO2: >50 mmol/L — ABNORMAL HIGH (ref 22–32)
pCO2 arterial: 95 mmHg (ref 32.0–48.0)
pH, Arterial: 7.337 — ABNORMAL LOW (ref 7.350–7.450)
pO2, Arterial: 100 mmHg (ref 83.0–108.0)

## 2020-10-03 LAB — CBC
HCT: 49.2 % (ref 39.0–52.0)
Hemoglobin: 13.5 g/dL (ref 13.0–17.0)
MCH: 31 pg (ref 26.0–34.0)
MCHC: 27.4 g/dL — ABNORMAL LOW (ref 30.0–36.0)
MCV: 112.8 fL — ABNORMAL HIGH (ref 80.0–100.0)
Platelets: 142 10*3/uL — ABNORMAL LOW (ref 150–400)
RBC: 4.36 MIL/uL (ref 4.22–5.81)
RDW: 14.5 % (ref 11.5–15.5)
WBC: 13.9 10*3/uL — ABNORMAL HIGH (ref 4.0–10.5)
nRBC: 0 % (ref 0.0–0.2)

## 2020-10-03 LAB — MAGNESIUM: Magnesium: 2.7 mg/dL — ABNORMAL HIGH (ref 1.7–2.4)

## 2020-10-03 LAB — PHOSPHORUS: Phosphorus: 3.2 mg/dL (ref 2.5–4.6)

## 2020-10-03 MED ORDER — FUROSEMIDE 10 MG/ML IJ SOLN
40.0000 mg | Freq: Every day | INTRAMUSCULAR | Status: DC
  Administered 2020-10-04 – 2020-10-05 (×2): 40 mg via INTRAVENOUS
  Filled 2020-10-03 (×2): qty 4

## 2020-10-03 MED ORDER — FREE WATER
250.0000 mL | Status: DC
  Administered 2020-10-03 – 2020-10-05 (×24): 250 mL

## 2020-10-03 MED ORDER — ACETAZOLAMIDE 250 MG PO TABS
500.0000 mg | ORAL_TABLET | Freq: Every day | ORAL | Status: DC
  Administered 2020-10-03: 500 mg via ORAL
  Filled 2020-10-03 (×2): qty 2

## 2020-10-03 MED ORDER — FENTANYL CITRATE (PF) 100 MCG/2ML IJ SOLN
25.0000 ug | Freq: Once | INTRAMUSCULAR | Status: AC
  Administered 2020-10-03: 25 ug via INTRAVENOUS
  Filled 2020-10-03: qty 2

## 2020-10-03 MED ORDER — ACETAZOLAMIDE 250 MG PO TABS
500.0000 mg | ORAL_TABLET | Freq: Two times a day (BID) | ORAL | Status: DC
  Filled 2020-10-03: qty 2

## 2020-10-03 NOTE — Progress Notes (Signed)
Critical ABG values given to RN.

## 2020-10-03 NOTE — Progress Notes (Addendum)
NAME:  Paul Branch, MRN:  [id number], DOB:  11/13/1955, LOS: 11 ADMISSION DATE:  10/04/2020, CONSULTATION DATE:  09/15/2020 REFERRING MD:  ED CHIEF COMPLAINT:  Respiratory distress  History of Present Illness:  Paul Branch is a 65 year old gentleman with PMH significant for COPD on 2L home oxygen, nicotine dependency, hypertension and hyperlipidemia who presented to [facility] 4/17 via EMS with worsening dyspnea. The patient's wife reported that he had become more short of breath & mental status became impaired so EMS was called. He was found to be hypoxic with saturations of 70% on 2 liters nasal cannula Oxygen. The patient was initially placed on 100% FIO2 but soon afterward intubated because of hypercarbia and hypoxia. His COVID 19 and Influenza panel were negative. It is reported that patient received diuretic therapy in the ED with 1800 ml diuresis followed by hypotension.  Hence he was given IVF and required Levophed post intubation.  CXR is consistent with right lobar pneumonia. He was transferred to [facility] for further care.  On 4/19, he was noted to have changes on mechanical ventilation worrisome for ETT obstruction, ETT was exchanged. Post exchange film with pneumothorax requiring chest tube placement.   Pertinent  Medical History  COPD, chronic respiratory failure with hypercapnia and hypoxemia  Significant Hospital Events:   . 4/17 admitted, intubated CAP, COPD exacerbation, CTX/Azithro . 4/18 Alert communicative, needs more sedation although not fighting vent . 4/19 Agitated, poor ventilation noted on waveforms/volumes via vent, peak pressures mid 50s, distal obstruction in ETT (unable to pass suction catheter) with high peak:plateau consistent with airway resistance, refractory hypoxemia so ETT exchanged via bougie, fu CXR with R PTX, small bore tube placed without complete re-expansion, worsening hypoxemia overnight, CTA chest with PTX, LLL consolidation, RLL atelectasis,  No PE, 18F surgical CT placed, hypoxemia slowly improved, still on 100%/PEEP 10 . 4/20 Sats in 80s most of day, improved overnight to 4/21 weaning FiO2 slightly . 4/21 Sats in low 90s on 100% . 4/22 unchanged, diuresed . 4/23 unchanged, diuresed . 4/24 Sats mildly better, PTX appears nearly resolved which is improved, continue diuresis. BM's better. . 4/25: Had to increase FiO2 to 100% overnight for sat drops, now back to 85%, BP labile on Precedex, Good diuresis . 4/26: Chest tube was placed under waterseal, repeat x-ray chest showed a stable very small pneumothorax, remains on high vent setting with rising PCO2  Interim History / Subjective:  Overnight with episodes of tachycardia, CXR with tiny right pneumothorax which appears similar to CXR from 4/17  Objective   Blood pressure 136/82, pulse (!) 128, temperature 99.9 F (37.7 C), temperature source Axillary, resp. rate (!) 34, height 5\' 11"  (1.803 m), weight 85 kg, SpO2 94 %.    Vent Mode: PRVC FiO2 (%):  [80 %-100 %] 80 % Set Rate:  [26 bmp-34 bmp] 34 bmp Vt Set:  [460 mL] 460 mL PEEP:  [10 cmH20] 10 cmH20 Plateau Pressure:  [22 cmH20-28 cmH20] 28 cmH20   Intake/Output Summary (Last 24 hours) at 10/03/2020 6063 Last data filed at 10/03/2020 0600 Gross per 24 hour  Intake 2273.94 ml  Output 2875 ml  Net -601.06 ml   Filed Weights   10/01/20 0347 10/02/20 0500 10/03/20 0406  Weight: 84.2 kg 84.3 kg 85 kg    Examination: General: Elderly male on vent  HEENT: ETT/OG in place  Neuro: Opens eyes at time to speech, does not follow commands, moves extremities spontaneously  CV: Tachycardic, no MRG  PULM: Diminished breath sounds, vent assisted breaths,  Right-sided large bore chest tube in place no air leak noted  GI: soft, non-distended, active bowel sounds  Extremities: generalized edema  Skin: warm, intact    Resolved Hospital Problem list   Septic Shock due to PNA Elevated troponin due to demand ischemia Obstructive  Shock due to PTX  Assessment & Plan:   Acute on Chronic Hypoxic and Hypercarbic Respiratory Failure secondary to Community Acquired Right Lobar Pneumonia (CAP, NOS) and acute COPD exacerbation Right Pneumothorax  Acute respiratory acidosis CT significant underlying emphysema, large shunt via PNA Developed ETT obstruction 4/19, exchanged. Post film showed PTX on Rt. Tracheal aspirate negative.Treated with CTX, Azithro from 4/17 - 4/22. Plan Continue chest tube under waterseal Trend CXR and ABG  VAP prevention measures  Continue diuresis with Lasix and acetazolamide  AKI  Hypernatremia  ATN from hypotension, received contrast with CTA.  Serum sodium is trending up due to aggressive diuresis Plan Trend BMP/urinary output Replace electrolytes as indicated Increase free water flushes Avoid nephrotoxic agents, ensure adequate renal perfusion  Pulmonary Nodule L Upper Lobe, Concerning for malignancy Increased in size from 1.8 cm to 2.6 cm  Pt is high risk current every day smoker  Plan Consider bronchoscopy for tissue sampling > Currently with ongoing goals of care with family.   Macrocytosis  Thrombocytopenia Plan  Trend CBC   H/O Hypertension, BP currently labile Plan -hold home antihypertensives   Hyperlipidemia Plan -continue lipitor   Best practice (right click and "Reselect all SmartList Selections" daily)  Diet:  Tube Feed  Pain/Anxiety/Delirium protocol (if indicated): Yes (RASS goal -2) VAP protocol (if indicated): Yes DVT prophylaxis: LMWH GI prophylaxis: PPI Glucose control:  SSI Yes Central venous access:  N/A Arterial line:  N/A Foley: Yes but discontinue today Mobility:  bed rest  PT consulted: N/A Last date of multidisciplinary goals of care discussion - son 4/27 updated via phone on plan of care. > Re-address goals of care today. Comfort vs Trach. Son stated they needed more time. Hope to have decision by Monday  Code Status:  full code Disposition:  ICU  Total critical care time: 32 minutes  Performed by: K.Joliana Claflin   Critical care time was exclusive of separately billable procedures and treating other patients.   Critical care was necessary to treat or prevent imminent or life-threatening deterioration.   Critical care was time spent personally by me on the following activities: development of treatment plan with patient and/or surrogate as well as nursing, discussions with consultants, evaluation of patient's response to treatment, examination of patient, obtaining history from patient or surrogate, ordering and performing treatments and interventions, ordering and review of laboratory studies, ordering and review of radiographic studies, pulse oximetry and re-evaluation of patient's condition.    Hayden Pedro, AGACNP-BC [facility]  PCCM Pgr: [phone]

## 2020-10-03 NOTE — Progress Notes (Addendum)
eLink Physician-Brief Progress Note Patient Name: Paul Branch DOB: Mar 23, 1956 MRN: [id number]   Date of Service  10/03/2020  HPI/Events of Note  Notified of tachycardia. RN had obtained an EKG. Sinus tach noted. Remains on same vent support. 100%/peep 10. No desaturation. RN noted patient was more awake and intermittently following commands but this is not consistent. Sedation was turned off totally. He has 100 mic fentanyl ordered PRN, no doses were  given  eICU Interventions  We will try a 25 mic fentanyl push to see if this is pain related  No fever, and otherwise no changes Also get a CXR since chest tube is to water seal with a persistent small pneumo     Intervention Category Major Interventions: Arrhythmia - evaluation and management  Margaretmary Lombard 10/03/2020, 5:19 AM   Addendum- No significant change in pneumothorax noted, continue tube to water seal

## 2020-10-04 ENCOUNTER — Inpatient Hospital Stay (HOSPITAL_COMMUNITY): Payer: Medicare HMO

## 2020-10-04 DIAGNOSIS — Z515 Encounter for palliative care: Secondary | ICD-10-CM

## 2020-10-04 DIAGNOSIS — Z9911 Dependence on respirator [ventilator] status: Secondary | ICD-10-CM

## 2020-10-04 DIAGNOSIS — Z7189 Other specified counseling: Secondary | ICD-10-CM

## 2020-10-04 LAB — POCT I-STAT 7, (LYTES, BLD GAS, ICA,H+H)
Acid-Base Excess: 17 mmol/L — ABNORMAL HIGH (ref 0.0–2.0)
Bicarbonate: 48 mmol/L — ABNORMAL HIGH (ref 20.0–28.0)
Calcium, Ion: 1.23 mmol/L (ref 1.15–1.40)
HCT: 41 % (ref 39.0–52.0)
Hemoglobin: 13.9 g/dL (ref 13.0–17.0)
O2 Saturation: 90 %
Patient temperature: 99.5
Potassium: 4.5 mmol/L (ref 3.5–5.1)
Sodium: 147 mmol/L — ABNORMAL HIGH (ref 135–145)
TCO2: >50 mmol/L — ABNORMAL HIGH (ref 22–32)
pCO2 arterial: 95 mmHg (ref 32.0–48.0)
pH, Arterial: 7.314 — ABNORMAL LOW (ref 7.350–7.450)
pO2, Arterial: 71 mmHg — ABNORMAL LOW (ref 83.0–108.0)

## 2020-10-04 LAB — CBC
HCT: 45.9 % (ref 39.0–52.0)
Hemoglobin: 12.7 g/dL — ABNORMAL LOW (ref 13.0–17.0)
MCH: 31 pg (ref 26.0–34.0)
MCHC: 27.7 g/dL — ABNORMAL LOW (ref 30.0–36.0)
MCV: 112 fL — ABNORMAL HIGH (ref 80.0–100.0)
Platelets: 144 10*3/uL — ABNORMAL LOW (ref 150–400)
RBC: 4.1 MIL/uL — ABNORMAL LOW (ref 4.22–5.81)
RDW: 14.8 % (ref 11.5–15.5)
WBC: 17.1 10*3/uL — ABNORMAL HIGH (ref 4.0–10.5)
nRBC: 0 % (ref 0.0–0.2)

## 2020-10-04 LAB — PHOSPHORUS: Phosphorus: 3.6 mg/dL (ref 2.5–4.6)

## 2020-10-04 LAB — GLUCOSE, CAPILLARY
Glucose-Capillary: 131 mg/dL — ABNORMAL HIGH (ref 70–99)
Glucose-Capillary: 136 mg/dL — ABNORMAL HIGH (ref 70–99)
Glucose-Capillary: 144 mg/dL — ABNORMAL HIGH (ref 70–99)
Glucose-Capillary: 129 mg/dL — ABNORMAL HIGH (ref 70–99)
Glucose-Capillary: 138 mg/dL — ABNORMAL HIGH (ref 70–99)
Glucose-Capillary: 128 mg/dL — ABNORMAL HIGH (ref 70–99)

## 2020-10-04 LAB — BASIC METABOLIC PANEL
Anion gap: 6 (ref 5–15)
BUN: 106 mg/dL — ABNORMAL HIGH (ref 8–23)
CO2: 45 mmol/L — ABNORMAL HIGH (ref 22–32)
Calcium: 8.9 mg/dL (ref 8.9–10.3)
Chloride: 96 mmol/L — ABNORMAL LOW (ref 98–111)
Creatinine, Ser: 1.29 mg/dL — ABNORMAL HIGH (ref 0.61–1.24)
GFR, Estimated: >60 mL/min (ref >60)
Glucose, Bld: 144 mg/dL — ABNORMAL HIGH (ref 70–99)
Potassium: 3.7 mmol/L (ref 3.5–5.1)
Sodium: 147 mmol/L — ABNORMAL HIGH (ref 135–145)

## 2020-10-04 LAB — MAGNESIUM: Magnesium: 2.8 mg/dL — ABNORMAL HIGH (ref 1.7–2.4)

## 2020-10-04 MED ORDER — PIPERACILLIN-TAZOBACTAM 3.375 G IVPB
3.3750 g | Freq: Three times a day (TID) | INTRAVENOUS | Status: DC
  Administered 2020-10-04 – 2020-10-05 (×3): 3.375 g via INTRAVENOUS
  Filled 2020-10-04 (×5): qty 50

## 2020-10-04 MED ORDER — ACETAZOLAMIDE 250 MG PO TABS
500.0000 mg | ORAL_TABLET | Freq: Two times a day (BID) | ORAL | Status: DC
  Administered 2020-10-04 (×2): 500 mg
  Filled 2020-10-04 (×3): qty 2

## 2020-10-04 MED ORDER — LIDOCAINE HCL (PF) 2 % IJ SOLN: 0.0000 mL | Freq: Once | INTRAMUSCULAR | Status: DC | PRN

## 2020-10-04 MED ORDER — BISACODYL 10 MG RE SUPP: 10.0000 mg | Freq: Every day | RECTAL | Status: DC | PRN

## 2020-10-04 MED ORDER — ACETAZOLAMIDE 250 MG PO TABS
500.0000 mg | ORAL_TABLET | Freq: Two times a day (BID) | ORAL | Status: DC
  Filled 2020-10-04: qty 2

## 2020-10-04 MED ORDER — POTASSIUM CHLORIDE 20 MEQ PO PACK
40.0000 meq | PACK | Freq: Once | ORAL | Status: AC
  Administered 2020-10-04: 40 meq
  Filled 2020-10-04: qty 2

## 2020-10-04 MED ORDER — BISACODYL 5 MG PO TBEC: 5.0000 mg | DELAYED_RELEASE_TABLET | Freq: Every day | ORAL | Status: DC | PRN

## 2020-10-04 NOTE — Progress Notes (Signed)
NAME:  Paul Branch, MRN:  [id number], DOB:  12/24/1955, LOS: 12 ADMISSION DATE:  09/25/2020, CONSULTATION DATE:  09/06/2020 REFERRING MD:  ED CHIEF COMPLAINT:  Respiratory distress  History of Present Illness:  Paul Branch is a 65 year old gentleman with PMH significant for COPD on 2L home oxygen, nicotine dependency, hypertension and hyperlipidemia who presented to [facility] 4/17 via EMS with worsening dyspnea. The patient's wife reported that he had become more short of breath & mental status became impaired so EMS was called. He was found to be hypoxic with saturations of 70% on 2 liters nasal cannula Oxygen. The patient was initially placed on 100% FIO2 but soon afterward intubated because of hypercarbia and hypoxia. His COVID 19 and Influenza panel were negative. It is reported that patient received diuretic therapy in the ED with 1800 ml diuresis followed by hypotension.  Hence he was given IVF and required Levophed post intubation.  CXR is consistent with right lobar pneumonia. He was transferred to [facility] for further care.  On 4/19, he was noted to have changes on mechanical ventilation worrisome for ETT obstruction, ETT was exchanged. Post exchange film with pneumothorax requiring chest tube placement.   Pertinent  Medical History  COPD, chronic respiratory failure with hypercapnia and hypoxemia  Significant Hospital Events:   . 4/17 admitted, intubated CAP, COPD exacerbation, CTX/Azithro . 4/18 Alert communicative, needs more sedation although not fighting vent . 4/19 Agitated, poor ventilation noted on waveforms/volumes via vent, peak pressures mid 50s, distal obstruction in ETT (unable to pass suction catheter) with high peak:plateau consistent with airway resistance, refractory hypoxemia so ETT exchanged via bougie, fu CXR with R PTX, small bore tube placed without complete re-expansion, worsening hypoxemia overnight, CTA chest with PTX, LLL consolidation, RLL atelectasis,  No PE, 14F surgical CT placed, hypoxemia slowly improved, still on 100%/PEEP 10 . 4/20 Sats in 80s most of day, improved overnight to 4/21 weaning FiO2 slightly . 4/21 Sats in low 90s on 100% . 4/22 unchanged, diuresed . 4/23 unchanged, diuresed . 4/24 Sats mildly better, PTX appears nearly resolved which is improved, continue diuresis. BM's better. . 4/25: Had to increase FiO2 to 100% overnight for sat drops, now back to 85%, BP labile on Precedex, Good diuresis . 4/26: Chest tube was placed under waterseal, repeat x-ray chest showed a stable very small pneumothorax, remains on high vent setting with rising PCO2 . 4/29 CT to water seal, Pneumo resolved per CXR 4/29, remains on 90% per vent, tachy, awaiting family to make decision about comfort care vs trach.   Interim History / Subjective:  Continued tachycardia, Remains on 90% per the vent.  CXR 4/29 Resolved right apical pneumothorax. Stable pulmonary infiltrates infectious vs inflammatory. I do not see a leak in CT which is to water seal. 30 cc of CT drainage last 24 hours 2245 urine output last 24. Currently on 4 of Levophed. WBC increased to 17.1 from 13.9 overnight, T max 99.9,antibiotic therapy completed Platelets 142 K, HGB 12.7 Na 147, K 3.9, Mag 2.8, BUN 106 from 96, creatinine 1.29 from 1.27 ALT 53 from 60 No ABG  Objective   Blood pressure 137/80, pulse (!) 121, temperature 99.5 F (37.5 C), temperature source Axillary, resp. rate (!) 35, height 5\' 11"  (1.803 m), weight 85 kg, SpO2 91 %.    Vent Mode: PRVC FiO2 (%):  [50 %-100 %] 90 % Set Rate:  [34 bmp] 34 bmp Vt Set:  [460 mL] 460 mL PEEP:  [  [place] Pressure:  [24 UMP53-61 cmH20] 27 cmH20   Intake/Output Summary (Last 24 hours) at 10/04/2020 0819 Last data filed at 10/04/2020 0600 Gross per 24 hour  Intake 4554.36 ml  Output 2275 ml  Net 2279.36 ml   Filed Weights   10/01/20 0347 10/02/20 0500 10/03/20 0406  Weight: 84.2 kg 84.3 kg 85 kg     Examination: General: Elderly male on vent HEENT: ETT/OG secure and  in place, tube feeds infusing  Neuro: Opens eyes at times to touch and speech, does  follow commands at intervals per nursing, sticks out tongue , weakly wiggles toes on command. moves extremities spontaneously  CV: Tachycardic, no MRG, accentuated S1 PULM: Bilateral chest excursion, Diminished breath sounds per bases, vent assisted breaths, Rhonchi, no wheeze,  Right-sided large bore chest tube in place no air leak noted to water seal GI: soft, non-distended, active bowel sounds  GU: Flexiseal Extremities: 1-2 +generalized edema  Skin: warm, dry, intact    Resolved Hospital Problem list   Septic Shock due to PNA Elevated troponin due to demand ischemia Obstructive Shock due to PTX  Assessment & Plan:   Acute on Chronic Hypoxic and Hypercarbic Respiratory Failure secondary to Community Acquired Right Lobar Pneumonia (CAP, NOS) and acute COPD exacerbation Right Pneumothorax  Acute respiratory acidosis CT significant underlying emphysema, large shunt via PNA Developed ETT obstruction 4/19, exchanged. Post film showed PTX on Rt. Tracheal aspirate negative.Treated with CTX, Azithro from 4/17 - 4/22. Day 14 Vent, trach vs comfort care >> family contemplating Plan Continue chest tube to waterseal Trend CXR and ABG  VAP prevention measures  Continue diuresis with Lasix and acetazolamide  Leukocytosis Increase in WBC overnight 4/29 13.9-17.1 Stable infiltrates per CXR T Max 99.9 Plan Tracheal Aspirate Culture Broad spectrum ABX until micro results then narrow  AKI  Hypernatremia  ATN from hypotension, received contrast with CTA.  Serum sodium is trending up due to aggressive diuresis Plan Trend BMP/urinary output Replace electrolytes as indicated Increase free water flushes Avoid nephrotoxic agents, ensure adequate renal perfusion  Pulmonary Nodule L Upper Lobe, Concerning for malignancy Increased in  size from 1.8 cm to 2.6 cm in 2 years, suspicious for adeno Pt is high risk current every day smoker  Plan Consider bronchoscopy for tissue sampling > Currently with ongoing goals of care discussions with family.   Macrocytosis  Thrombocytopenia>> Improving Plan  Trend CBC   H/O Hypertension, BP currently labile Plan -hold home antihypertensives - Levophed for MAP > 65   Hyperlipidemia Plan -continue lipitor   Best practice (right click and "Reselect all SmartList Selections" daily)  Diet:  Tube Feed  Pain/Anxiety/Delirium protocol (if indicated): Yes (RASS goal -2) VAP protocol (if indicated): Yes DVT prophylaxis: LMWH GI prophylaxis: PPI Glucose control:  SSI Yes Central venous access:  N/A Arterial line:  N/A Foley: Yes but discontinue today Mobility:  bed rest  PT consulted: N/A Last date of multidisciplinary goals of care discussion - son 4/27 updated via phone on plan of care. > Re-address goals of care today. Comfort vs Trach. Son stated they needed more time. Hope to have decision by Monday  Code Status:  full code Disposition: ICU  Total critical care time: 33 minutes  Performed by: S. Alexy Heldt, NP   Critical care time was exclusive of separately billable procedures and treating other patients.   Critical care was necessary to treat or prevent imminent or life-threatening deterioration.   Critical care was time spent personally by  me on the following activities: development of treatment plan with patient and/or surrogate as well as nursing, discussions with consultants, evaluation of patient's response to treatment, examination of patient, obtaining history from patient or surrogate, ordering and performing treatments and interventions, ordering and review of laboratory studies, ordering and review of radiographic studies, pulse oximetry and re-evaluation of patient's condition.    [name], MSN, AGACNP-BC [place]  for personal pager PCCM on call pager [phone] Use only, not for office use 10/04/2020 8:19 AM

## 2020-10-04 NOTE — Progress Notes (Signed)
Brief Progress Note  ABG reviewed ( see below) Bicarb is 48 Will increase Diamox to BID to attempt to decrease bicarb Will continue Lasix daily.    [name], MSN, AGACNP-BC [place] for personal pager PCCM on call pager [phone] 10/04/2020 9:59 AM   10/04/2020 09:57  Ref. Range 10/04/2020 09:27  pH, Arterial Latest Ref Range: 7.350 - 7.450  7.314 (L)  pCO2 arterial Latest Ref Range: 32.0 - 48.0 mmHg 95.0 (HH)  pO2, Arterial Latest Ref Range: 83.0 - 108.0 mmHg 71 (L)  TCO2 Latest Ref Range: 22 - 32 mmol/L >50 (H)  Acid-Base Excess Latest Ref Range: 0.0 - 2.0 mmol/L 17.0 (H)  Bicarbonate Latest Ref Range: 20.0 - 28.0 mmol/L 48.0 (H)  O2 Saturation Latest Units: % 90.0  Patient temperature Unknown 99.5 F  Collection site Unknown Radial  Sodium Latest Ref Range: 135 - 145 mmol/L 147 (H)  Potassium Latest Ref Range: 3.5 - 5.1 mmol/L 4.5  Calcium Ionized Latest Ref Range: 1.15 - 1.40 mmol/L 1.23  Hemoglobin Latest Ref Range: 13.0 - 17.0 g/dL 13.9  HCT Latest Ref Range: 39.0 - 52.0 % 41.0

## 2020-10-04 NOTE — Progress Notes (Signed)
Pharmacy Electrolyte Replacement  Recent Labs:  Recent Labs    10/04/20 0052  K 3.7  MG 2.8*  PHOS 3.6  CREATININE 1.29*    Low Critical Values (K </= 2.5, Phos </= 1, Mg </= 1) Present: None  MD Contacted: N/A  Plan: Replaced per protocol  Rebbeca Paul, PharmD PGY1 Pharmacy Resident 10/04/2020 6:45 AM  Please check [email or web address] for unit-specific pharmacy phone numbers.

## 2020-10-04 NOTE — Consult Note (Signed)
Consultation Note Date: 10/04/2020   Patient Name: Paul Branch  DOB: September 28, 1955  MRN: [id number]  Age / Sex: 65 y.o., male  PCP: Sharyne Peach, MD Referring Physician: Jacky Kindle, MD  Reason for Consultation: Establishing goals of care  HPI/Patient Profile: 65 y.o. male  with past medical history of COPD, stroke, hypertension, hyperlipidemia, degenerative disc disease admitted on 09/12/2020 with shortness of breath with community acquired pneumonia. 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   Clinical Assessment and Goals of Care: I met today with Ziyon's son, Lenox Ponds (and his wife), while Joncarlo was being washed up. They have just spoken with Dr. Tacy Learn and have made the decision to proceed with tracheostomy placement. They share that they feel they have to do something and cannot "just give up" on [place].   We spent time talking about who Crue is and what is important to him. He is a loving husband, father to 28, and grandfather to many (each of his children have 3-9 children each!). He loves children and told his children to each have 10 children! He is very much all about his family. He is a very spiritual and religious many and family. He also never really sits still and is always "tinkering" with something.   We further discussed goals moving forward and expectations. They are very much still hopeful that he can eventually have some improvement and come off ventilator. They were able to express that they would never want him to have to live in a facility or be bedbound long term (months) as they say he would not want that. We discussed my concern about lung mass and that this could be a significant barrier to [place] improving as they hoped. I am concerned that this could be a potential  cancer and [place] is very debilitated and would not be able to have any treatment in a state as he is currently in. Lenox Ponds expresses that he understands that his father is critically ill and there is a possibility his father will not survive tracheostomy procedure but they still want to try. We were able to discuss that as we learn more information and reassess expectations for Khi's care we can continue to discuss plans and make sure this path still makes sense for Dusty. We discussed following his lead as his body will begin to show some improvement or he will continue to decline. Family is open to ongoing conversations to ensure that Elmin's care continues to be aligned with goals with the understanding that this could change tomorrow, next week, or next month.    All questions/concerns addressed. Emotional support provided.   Primary Decision Maker NEXT OF KIN wife and children make decisions together as family    SUMMARY OF RECOMMENDATIONS   - Plans in place prior to my meeting to pursue tracheostomy - Ongoing goals of care conversations recommended  Code Status/Advance Care Planning:  Full code  Symptom Management:   Per PCCM.   Palliative Prophylaxis:   Aspiration and Bowel Regimen, Delirium precautions  Additional Recommendations (Limitations, Scope, Preferences):  Full Scope Treatment  Psycho-social/Spiritual:   Desire for further Chaplaincy support:no  Additional Recommendations: Grief/Bereavement Support  Prognosis:   Overall prognosis poor with significant support required from vent to support him for 2 weeks time. Not showing improvement in lung function even with resolved pneumo.   Discharge Planning: To Be Determined      Primary Diagnoses: Present on Admission: . CAP (community acquired pneumonia) . Pneumonia   I have reviewed the medical record, interviewed the patient and family, and examined the patient. The following aspects are  pertinent.  Past Medical History:  Diagnosis Date  . COPD (chronic obstructive pulmonary disease) ([place])   . DDD (degenerative disc disease), thoracolumbar 01/25/2013  . Depression   . Disease characterized by destruction of skeletal muscle 02/12/2019  . Hyperlipidemia   . Hypertension   . Stroke ([place])   . Thyroid enlarged 02/19/2019   Social History   Socioeconomic History  . Marital status: Married    Spouse name: Not on file  . Number of children: 5  . Years of education: Not on file  . Highest education level: Not on file  Occupational History  . Not on file  Tobacco Use  . Smoking status: Current Every Day Smoker    Packs/day: 0.25    Years: 46.00    Pack years: 11.50    Types: Cigarettes  . Smokeless tobacco: Never Used  . Tobacco comment: wearing patches, using Nicorette, smokes 3-4 cigs. per day  Vaping Use  . Vaping Use: Never used  Substance and Sexual Activity  . Alcohol use: Never  . Drug use: Never  . Sexual activity: Not on file  Other Topics Concern  . Not on file  Social History Narrative  . Not on file   Social Determinants of Health   Financial Resource Strain: Not on file  Food Insecurity: Not on file  Transportation Needs: Not on file  Physical Activity: Not on file  Stress: Not on file  Social Connections: Not on file   Family History  Problem Relation Age of Onset  . Stroke Mother    Scheduled Meds: . acetaZOLAMIDE  500 mg Per Tube BID  . aspirin  81 mg Per NG tube Daily  . atorvastatin  20 mg Per NG tube Daily  . budesonide (PULMICORT) nebulizer solution  0.5 mg Nebulization BID  . chlorhexidine gluconate (MEDLINE KIT)  15 mL Mouth Rinse BID  . Chlorhexidine Gluconate Cloth  6 each Topical Q0600  . docusate  100 mg Per Tube BID  . enoxaparin (LOVENOX) injection  40 mg Subcutaneous QHS  . feeding supplement (PROSource TF)  45 mL Per Tube Daily  . free water  250 mL Per Tube Q2H  . furosemide  40 mg Intravenous Daily  . insulin aspart   0-9 Units Subcutaneous Q4H  . ipratropium-albuterol  3 mL Nebulization TID  . mouth rinse  15 mL Mouth Rinse 10 times per day  . multivitamin  15 mL Per Tube Daily  . pantoprazole sodium  40 mg Per Tube Daily  . polyethylene glycol  17 g Per Tube Daily  . pramipexole  0.25 mg Per Tube QPM  . sennosides  5 mL Per Tube BID   Continuous Infusions: . sodium chloride 999 mL/hr at 09/24/20 1443  . sodium chloride Stopped (10/01/20 0345)  . feeding supplement (VITAL  AF 1.2 CAL) 1,000 mL (10/04/20 0533)  . norepinephrine (LEVOPHED) Adult infusion 3.5 mcg/min (10/04/20 1512)  . piperacillin-tazobactam (ZOSYN)  IV     PRN Meds:.bisacodyl, docusate, fentaNYL (SUBLIMAZE) injection, hydrALAZINE, lidocaine HCl (PF), polyethylene glycol No Known Allergies Review of Systems  Unable to perform ROS: Intubated    Physical Exam Vitals and nursing note reviewed.  Constitutional:      Appearance: He is ill-appearing.     Comments: Sedated on vent  Cardiovascular:     Rate and Rhythm: Tachycardia present.  Pulmonary:     Comments: 100% FiO2. Tolerating vent.  Abdominal:     General: Abdomen is flat.     Palpations: Abdomen is soft.  Neurological:     Comments: Sedated on vent     Vital Signs: BP 100/61   Pulse (!) 104   Temp 99.4 F (37.4 C) (Axillary)   Resp (!) 32   Ht 5' 11"  (1.803 m)   Wt 85 kg   SpO2 93%   BMI 26.14 kg/m  Pain Scale: CPOT   Pain Score: Asleep   SpO2: SpO2: 93 % O2 Device:SpO2: 93 % O2 Flow Rate: .   IO: Intake/output summary:   Intake/Output Summary (Last 24 hours) at 10/04/2020 1538 Last data filed at 10/04/2020 1000 Gross per 24 hour  Intake 3229.28 ml  Output 1050 ml  Net 2179.28 ml    LBM: Last BM Date: 10/03/20 Baseline Weight: Weight: 88.9 kg Most recent weight: Weight: 85 kg     Palliative Assessment/Data:     Time In: 1400 Time Out: 1450 Time Total: 50 min Greater than 50%  of this time was spent counseling and coordinating care  related to the above assessment and plan.  Signed by: Vinie Sill, NP Palliative Medicine Team Pager # [phone] (M-F 8a-5p) Team Phone # [phone] (Nights/Weekends)

## 2020-10-04 NOTE — Progress Notes (Signed)
Brief Progress Note I called and spoke with patient's son. I gave him an update on his father's condition. 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 I asked if he and his family had made a decision about the options of trach vs comfort care as we are at 14 days on the vent. He stated that he is coming to the hospital today about 1 pm and would like to speak with Dr. Tacy Learn. I told the patient's son I would let Dr.Chand know he would be in about 1 pm, and that we will have the nurse contact us when the son arrives so they can discuss families wishes for goals of care.  [name], MSN, AGACNP-BC [place] for personal pager PCCM on call pager [phone] 10/04/2020 10:59 AM

## 2020-10-04 NOTE — Progress Notes (Signed)
Pharmacy Antibiotic Note  Paul Branch is a 65 y.o. male admitted on 09/12/2020. Patient is s/p 5 days of antibiotics for CAP/COPD exacerbation (completed 4/21). Pharmacy has now been consulted for Zosyn dosing for rising WBC (13.9>17.1). Tmax over last 24h is 99.6. CXR shows stable bilateral pulmonary infiltrates (infectious vs inflammatory). CCM treating with broad spectrum antibiotics until tracheal aspirate results.   Plan: Zosyn 3.375g IV q8h (4 hour infusion).  F/u TA, ability to de-escalate  Height: 5\' 11"  (180.3 cm) Weight: 85 kg (187 lb 6.3 oz) IBW/kg (Calculated) : 75.3  Temp (24hrs), Avg:99.2 F (37.3 C), Min:98.1 F (36.7 C), Max:99.6 F (37.6 C)  Recent Labs  Lab 09/30/20 0216 10/01/20 0041 10/01/20 0342 10/02/20 0237 10/02/20 0850 10/03/20 0014 10/04/20 0052  WBC 15.5* 15.5*  --  14.8* 14.7* 13.9* 17.1*  CREATININE 1.04 1.07  --  1.12  --  1.27* 1.29*  LATICACIDVEN  --   --  1.0  --   --   --   --     Estimated Creatinine Clearance: 60 mL/min (A) (by C-G formula based on SCr of 1.29 mg/dL (H)).    No Known Allergies  Antimicrobials this admission: Azithro 4/17 >> 4/21 CTX 4/17 >> 4/21 Zosyn 4/29 >>  Microbiology results: 4/17 Ucx: neg 4/17 bcx: neg 4/18 TA: rare GPR, normal flora (no pseudomonas, staph aureus) MRSA PCR negative  4/29 TA  Thank you for allowing pharmacy to be a part of this patient's care.  Rebbeca Paul, PharmD PGY1 Pharmacy Resident 10/04/2020 9:22 AM  Please check [email or web address] for unit-specific pharmacy phone numbers.

## 2020-10-04 NOTE — Treatment Plan (Signed)
Spoke with the patient's family, updated about patient's condition that he has not been improving and continued to require high amount of oxygen on ventilator. Patient's family decided to pursue tracheostomy route.  Patient's son was consented for percutaneous tracheostomy tomorrow.    Jacky Kindle MD [place] See Amion for pager If no response to pager, please call [phone] until 7pm After 7pm, Please call E-link [phone]

## 2020-10-05 ENCOUNTER — Inpatient Hospital Stay (HOSPITAL_COMMUNITY): Payer: Medicare HMO

## 2020-10-05 LAB — COMPREHENSIVE METABOLIC PANEL
ALT: 75 U/L — ABNORMAL HIGH (ref 0–44)
AST: 69 U/L — ABNORMAL HIGH (ref 15–41)
Albumin: 2.4 g/dL — ABNORMAL LOW (ref 3.5–5.0)
Alkaline Phosphatase: 58 U/L (ref 38–126)
Anion gap: 9 (ref 5–15)
BUN: 83 mg/dL — ABNORMAL HIGH (ref 8–23)
CO2: 42 mmol/L — ABNORMAL HIGH (ref 22–32)
Calcium: 8.5 mg/dL — ABNORMAL LOW (ref 8.9–10.3)
Chloride: 95 mmol/L — ABNORMAL LOW (ref 98–111)
Creatinine, Ser: 1.1 mg/dL (ref 0.61–1.24)
GFR, Estimated: >60 mL/min (ref >60)
Glucose, Bld: 156 mg/dL — ABNORMAL HIGH (ref 70–99)
Potassium: 3.8 mmol/L (ref 3.5–5.1)
Sodium: 146 mmol/L — ABNORMAL HIGH (ref 135–145)
Total Bilirubin: 0.6 mg/dL (ref 0.3–1.2)
Total Protein: 5.8 g/dL — ABNORMAL LOW (ref 6.5–8.1)

## 2020-10-05 LAB — BLOOD GAS, ARTERIAL
Acid-Base Excess: 19.4 mmol/L — ABNORMAL HIGH (ref 0.0–2.0)
Acid-Base Excess: 19.5 mmol/L — ABNORMAL HIGH (ref 0.0–2.0)
Bicarbonate: 47.8 mmol/L — ABNORMAL HIGH (ref 20.0–28.0)
Bicarbonate: 48.3 mmol/L — ABNORMAL HIGH (ref 20.0–28.0)
FIO2: 100
FIO2: 100
O2 Saturation: 96.5 %
O2 Saturation: 91.7 %
Patient temperature: 37.3
Patient temperature: 37.3
pCO2 arterial: >120 mmHg (ref 32.0–48.0)
pCO2 arterial: >120 mmHg (ref 32.0–48.0)
pH, Arterial: 7.209 — ABNORMAL LOW (ref 7.350–7.450)
pH, Arterial: 7.18 — CL (ref 7.350–7.450)
pO2, Arterial: 98.1 mmHg (ref 83.0–108.0)
pO2, Arterial: 72.7 mmHg — ABNORMAL LOW (ref 83.0–108.0)

## 2020-10-05 LAB — CBC
HCT: 44.9 % (ref 39.0–52.0)
Hemoglobin: 12.3 g/dL — ABNORMAL LOW (ref 13.0–17.0)
MCH: 31.3 pg (ref 26.0–34.0)
MCHC: 27.4 g/dL — ABNORMAL LOW (ref 30.0–36.0)
MCV: 114.2 fL — ABNORMAL HIGH (ref 80.0–100.0)
Platelets: UNDETERMINED 10*3/uL (ref 150–400)
RBC: 3.93 MIL/uL — ABNORMAL LOW (ref 4.22–5.81)
RDW: 14.5 % (ref 11.5–15.5)
WBC: 17.7 10*3/uL — ABNORMAL HIGH (ref 4.0–10.5)
nRBC: 0 % (ref 0.0–0.2)

## 2020-10-05 LAB — GLUCOSE, CAPILLARY
Glucose-Capillary: 152 mg/dL — ABNORMAL HIGH (ref 70–99)
Glucose-Capillary: 139 mg/dL — ABNORMAL HIGH (ref 70–99)

## 2020-10-05 MED ORDER — LORAZEPAM 2 MG/ML IJ SOLN: 2.0000 mg | Freq: Once | INTRAMUSCULAR | Status: AC

## 2020-10-05 MED ORDER — LORAZEPAM 2 MG/ML IJ SOLN
INTRAMUSCULAR | Status: AC
  Filled 2020-10-05: qty 1

## 2020-10-05 MED ORDER — FENTANYL 2500MCG IN NS 250ML (10MCG/ML) PREMIX INFUSION
0.0000 ug/h | INTRAVENOUS | Status: DC
  Administered 2020-10-05: 25 ug/h via INTRAVENOUS
  Filled 2020-10-05: qty 250

## 2020-10-05 MED ORDER — ACETAZOLAMIDE 250 MG PO TABS
250.0000 mg | ORAL_TABLET | Freq: Two times a day (BID) | ORAL | Status: DC
  Administered 2020-10-05: 250 mg
  Filled 2020-10-05 (×2): qty 1

## 2020-10-06 NOTE — Progress Notes (Signed)
PCCM INTERVAL PROGRESS NOTE  Called to bedside due to respiratory acidosis on ABG.   Vent Mode: PRVC FiO2 (%):  [85 %-100 %] 100 % Set Rate:  [32 bmp-34 bmp] 32 bmp (patient is breathing over at 34-35) Vt Set:  [460 mL-500 mL] 560 mL PEEP:  [10 cmH20] 10 cmH20 Plateau Pressure:  [24 cmH20-29 cmH20] 29 cmH20  ABG    Component Value Date/Time   PHART 7.209 (L) 10/07/20 0347   PCO2ART >120.0 (HH) 10/07/20 0347   PO2ART 98.1 10-07-2020 0347   HCO3 47.8 (H) Oct 07, 2020 0347   TCO2 >50 (H) 10/04/2020 0927   O2SAT 96.5 10-07-20 0347    CXR reviewed. Mostly unchanged. No re-expansion of pneumothorax. If anything, aeration has somewhat improved.   Patient is well sedated and synchronous with vent.   Patient being treated with antibiotics.   No wheeze on exam  Vent rate can't be increased much, plus he is already breathing over Vt set at 467mL which is 6cc/kg ideal body weight Vt increase to 589mL, plat is 30 with this change. Repeat ABG at 0600    Georgann Housekeeper, AGACNP-BC [facility]  See Amion for personal pager PCCM on call pager [phone] until 7pm. Please call Elink 7p-7a. [phone]  10/07/20 4:44 AM

## 2020-10-06 NOTE — Death Summary Note (Signed)
DEATH SUMMARY   Patient Details  Name: Paul Branch MRN: [id number] DOB: [birth year]  Admission/Discharge Information   Admit Date:  2020/10/21  Date of Death: Date of Death: 11-03-20  Time of Death: Time of Death: 1056/09/13  Length of Stay: Sep 17, 2022  Referring Physician: Sharyne Peach, MD   Reason(s) for Hospitalization  Acute on Chronic Hypoxic and Hypercarbic Respiratory Failure secondary to Community Acquired Right Lobar Pneumoniaand acute COPD exacerbation Septic shock due to pneumonia Obstructive shock due to right Pneumothorax status post chest tube placement Acute respiratory acidosis Acute kidney injury Hypernatremia Demand cardiac ischemia 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 year old male who with PMH significant for COPDon 2L home oxygen, nicotine dependency, hypertension and hyperlipidemia who presented to [facility]22-22 via EMS with worsening dyspnea. The patient's wife reported that he had become more short of breath & mental status became impaired so EMS was called. He was found to be hypoxic with saturations of 70% on 2 liters nasal cannula Oxygen. The patient was initially placed on 100% FIO2 but soon afterward intubated because of hypercarbia and hypoxia. His COVID 19 and Influenza panel were negative. It is reported that patient received diuretic therapy in the ED with 1800 ml diuresis followed by hypotension. Hence he was given IVF and required Levophed post intubation. CXR is consistent with right lobar pneumonia. He was transferred to [facility] for further care.   On 4/19, he was noted to have changes on mechanical ventilation worrisome for ETT obstruction, ETT was exchanged. Post exchange film with pneumothorax requiring chest tube placement.   Despite maximum ventilatory therapy patient did not improve, he was continued on IV antibiotics for community-acquired pneumonia, he completed 1 course of treatment and then he started having leukocytosis, antibiotics were restarted without improvement in gas exchange he continued to remain in respiratory acidosis.  CT scan chest showed possible left upper lobe lung mass concerning for malignancy Patient was requiring IV vasopressor initially due to septic shock from pneumonia once pneumonia was better his vasopressor requirement came off but last few days due to severe respiratory acidosis, he started getting hypotensive requiring Levophed infusion to maintain map goal of 65. Patient also also noted to have acute kidney injury likely due to prerenal initially and then ATN from hypotension, which is started improving, he continued to have hypernatremia due to aggressive diuresis, he was continued on free water flushes with improvement.  Initially his serum troponins were elevated, echocardiogram was done which showed no wall motion abnormalities, likely it was due to demand cardiac ischemia On 11/04/22 patient's started getting worse, he became hypoxic and hypercapnic with pH 7.18 and PCO2 over 120 despite maximum ventilatory therapy, he was unable to have a gas exchange due to dead space.  Patient's family was contacted, he was getting hypoxic, patient's family decided not to resuscitate him, they made him DNR and patient passed peacefully at 10:58 AM and he was declared dead.    Pertinent Labs and Studies  Significant Diagnostic Studies DG Abd 1 View  Result Date: 10/04/2020 CLINICAL DATA:  Abdominal distension. EXAM: ABDOMEN - 1 VIEW COMPARISON:  September 26, 2020. FINDINGS: The bowel  gas pattern is normal. No radio-opaque  calculi or other significant radiographic abnormality are seen. IMPRESSION: Negative. Electronically Signed   By: Marijo Conception M.D.   On: 10/04/2020 12:29   CT ANGIO CHEST PE W OR WO CONTRAST  Result Date: 09/25/2020 CLINICAL DATA:  Shortness of breath EXAM: CT ANGIOGRAPHY CHEST WITH CONTRAST TECHNIQUE: Multidetector CT imaging of the chest was performed using the standard protocol during bolus administration of intravenous contrast. Multiplanar CT image reconstructions and MIPs were obtained to evaluate the vascular anatomy. CONTRAST:  65mL OMNIPAQUE IOHEXOL 350 MG/ML SOLN COMPARISON:  Chest CT 01/23/2019 FINDINGS: Cardiovascular: Contrast injection is sufficient to demonstrate satisfactory opacification of the pulmonary arteries to the segmental level. There is no pulmonary embolus or evidence of right heart strain. The size of the main pulmonary artery is normal. Heart size is normal, with no pericardial effusion. The course and caliber of the aorta are normal. There is atherosclerotic calcification. No acute aortic syndrome. Mediastinum/Nodes: 10 mm right hilar lymph node. No axillary or mediastinal adenopathy. There is an esophageal catheter present. Lungs/Pleura: Endotracheal tube terminates just above the carina. The 2.6 cm left upper lobe nodule is increased in size from 1.8 cm (11:45). There is a large right pneumothorax, predominantly basilar. There is a small caliber chest tube present. Bibasilar atelectasis. Emphysema. Upper Abdomen: Contrast bolus timing is not optimized for evaluation of the abdominal organs. The visualized portions of the organs of the upper abdomen are normal. Musculoskeletal: No chest wall abnormality. No bony spinal canal stenosis. Review of the MIP images confirms the above findings. IMPRESSION: 1. No pulmonary embolus or acute aortic syndrome. 2. Large right pneumothorax, predominantly basilar. Small caliber right chest tube. 3. Increased size of left upper lobe pulmonary  nodule, now measuring up to 2.6 cm, previously 1.8 cm. This remains highly likely to be malignant. Aortic Atherosclerosis (ICD10-I70.0) and Emphysema (ICD10-J43.9). Electronically Signed   By: Ulyses Jarred M.D.   On: 09/25/2020 00:53   DG CHEST PORT 1 VIEW  Result Date: 10/15/2020 CLINICAL DATA:  65 year old male status post sepsis, respiratory failure, right pneumothorax. EXAM: PORTABLE CHEST 1 VIEW COMPARISON:  Portable chest 10/04/2020 and earlier. FINDINGS: Portable AP semi upright view at 0439 hours. Stable right chest tube. ETT tip in good position between the clavicles and carina. Enteric tube is stable and the left upper quadrant. Mediastinal contours remain normal. Stable lung volumes. No pneumothorax identified today. Continued perihilar mixed interstitial and airspace opacity. Ventilation remains stable, not significantly changed from 09/29/2020. IMPRESSION: 1.  Stable lines and tubes. 2. No pneumothorax. Stable ventilation since 09/29/2020 with confluent bilateral perihilar mixed interstitial and airspace opacity. Electronically Signed   By: Genevie Ann M.D.   On: 10/15/2020 06:41   DG Chest Port 1 View  Result Date: 10/04/2020 CLINICAL DATA:  Respiratory failure EXAM: PORTABLE CHEST 1 VIEW COMPARISON:  10/03/2020 FINDINGS: Endotracheal tube seen 3.5 cm above the carina. Nasogastric tube looped within the gastric fundus. Large bore right chest tube is unchanged. Right apical pneumothorax has resolved. Stable bilateral multifocal pulmonary infiltrates, predominantly within the mid lung zones, likely infectious or inflammatory. No pneumothorax or pleural effusion. Cardiac size within normal limits. IMPRESSION: Stable support tubes. Resolved right apical pneumothorax. Stable pulmonary infiltrates. Electronically Signed   By: Fidela Salisbury MD   On: 10/04/2020 06:14   DG Chest Port 1 View  Result Date: 10/03/2020 CLINICAL DATA:  Pneumothorax. EXAM: PORTABLE CHEST 1 VIEW COMPARISON:  10/02/2020.  CT  09/25/2020. FINDINGS: Endotracheal tube, NG  tube, right chest tube in stable position. Tiny right pneumothorax again noted. Heart size stable. Bilateral pulmonary infiltrates/edema again noted. Left upper lobe pulmonary nodule/mass best identified by prior CT. No prominent pleural effusion. Right chest wall mild subcutaneous emphysema again noted. IMPRESSION: 1. Lines and tubes including right chest tube in stable position. Tiny right pneumothorax again noted. 2. Bilateral pulmonary infiltrates/edema again noted. Left upper lobe pulmonary nodule/mass best identified by prior CT. Electronically Signed   By: Marcello Moores  Register   On: 10/03/2020 05:42   DG CHEST PORT 1 VIEW  Result Date: 10/02/2020 CLINICAL DATA:  65 year old male status post sepsis, respiratory failure, right pneumothorax. EXAM: PORTABLE CHEST 1 VIEW COMPARISON:  Portable chest 10/01/2020 and earlier. FINDINGS: Portable AP semi upright view at 0410 hours. Stable right chest tube coursing to the medial right lung apex. Stable small residual right pneumothorax, pleural edge visible laterally. Endotracheal tube tip in good position between the clavicles and carina. Stable enteric tube looped in the left upper quadrant. Stable lung volumes. Mediastinal contours remain within normal limits. Coarse bilateral perihilar and more generalized reticulonodular opacity persists. Ventilation not significantly changed since 09/29/2020. No new pulmonary abnormality. Negative visible bowel gas pattern. No acute osseous abnormality identified. IMPRESSION: 1.  Stable lines and tubes.   Stable small right pneumothorax. 2. Ventilation not significantly changed from 09/29/2020. Confluent perihilar and background coarse reticulonodular opacity in both lungs. Electronically Signed   By: Genevie Ann M.D.   On: 10/02/2020 04:16   DG CHEST PORT 1 VIEW  Result Date: 10/01/2020 CLINICAL DATA:  65 year old male with pneumothorax. Follow-up exam. EXAM: PORTABLE CHEST 1 VIEW  COMPARISON:  Earlier radiograph dated 10/01/2020. FINDINGS: Endotracheal tube above the carina and enteric tube extending below the diaphragm with tip in similar position. Stable positioning of the right chest tube with tip over the right apex. There is minimal right pneumothorax measuring up to approximately 3 mm. No interval change in bilateral patchy pulmonary opacities. No large pleural effusion. Stable cardiomediastinal silhouette. Atherosclerotic calcification of the aorta. No acute osseous pathology. IMPRESSION: 1. Minimal right pneumothorax. 2. No interval change in the bilateral patchy pulmonary opacities. 3. Stable positioning of the support apparatus. Electronically Signed   By: Anner Crete M.D.   On: 10/01/2020 19:46   DG CHEST PORT 1 VIEW  Result Date: 10/01/2020 CLINICAL DATA:  Respiratory failure, hypoxia EXAM: PORTABLE CHEST 1 VIEW COMPARISON:  09/30/2020 FINDINGS: Endotracheal tube seen 4.3 cm above the carina. Nasogastric tube is seen with its tip coiled within the gastric fundus. Large bore right chest tube is unchanged with its tip within the medial apex. The lungs are symmetrically well expanded. Bilateral mid lung zone airspace infiltrate persists, likely infectious in the acute setting. No pneumothorax or pleural effusion. Cardiac size is within normal limits. IMPRESSION: Stable support tubes. Stable pulmonary insufflation. Right chest tube in place.  No pneumothorax. Stable bilateral pulmonary infiltrates, likely infectious. Electronically Signed   By: Fidela Salisbury MD   On: 10/01/2020 05:01   DG CHEST PORT 1 VIEW  Result Date: 09/30/2020 CLINICAL DATA:  Pneumothorax with chest tube in place EXAM: PORTABLE CHEST 1 VIEW COMPARISON:  September 29, 2020 FINDINGS: Chest tube remains in place on the right with small right apical pneumothorax. Endotracheal tube tip is 5.3 cm above the carina. Nasogastric tube tip and side port in stomach. Airspace opacity is again noted in the right mid  lung as well as throughout much of the left upper and mid lung regions. Heart size  is upper normal with pulmonary vascularity normal. No adenopathy. There is aortic atherosclerosis. No bone lesions. IMPRESSION: Tube and catheter positions as described with small right apical pneumothorax, unchanged. No tension component. Air face opacity bilaterally, stable on the right and somewhat increased on the left. Stable cardiac silhouette. Aortic Atherosclerosis (ICD10-I70.0). Electronically Signed   By: Lowella Grip III M.D.   On: 09/30/2020 10:01   DG CHEST PORT 1 VIEW  Result Date: 09/29/2020 CLINICAL DATA:  Follow-up chest tube placement EXAM: PORTABLE CHEST 1 VIEW COMPARISON:  09/28/2020 FINDINGS: The ET tube tip is above the carina. Right-sided chest tube is in place. Tiny pneumothorax overlying the right upper lobe appears unchanged. This measures approximately 4 mm in thickness. ETT tip is stable above the carina. There is an NG tube which is looped in the proximal stomach. Bilateral interstitial opacities and right upper lobe airspace disease is unchanged. IMPRESSION: 1. Stable right-sided chest tube with persistent small right upper lobe pneumothorax. 2. No change in aeration to the lungs compared with prior exam. Electronically Signed   By: Kerby Moors M.D.   On: 09/29/2020 09:39   DG CHEST PORT 1 VIEW  Result Date: 09/28/2020 CLINICAL DATA:  Hypoxia on ventilation. EXAM: PORTABLE CHEST 1 VIEW COMPARISON:  09/27/2020 FINDINGS: The endotracheal tube has been withdrawn with tip now measuring about 3.7 cm above the carina. Enteric tube is present with tip coiled in the left upper quadrant consistent with location in the upper stomach. Right chest tube remains in place. Small residual right pneumothorax. Patchy infiltrates in both lungs could represent edema or multifocal pneumonia. No pleural effusions. Subcutaneous emphysema in the right lateral chest wall. IMPRESSION: Appliances appear in  satisfactory location. Small residual right pneumothorax. Patchy bilateral pulmonary infiltrates. Electronically Signed   By: Lucienne Capers M.D.   On: 09/28/2020 01:44   DG CHEST PORT 1 VIEW  Result Date: 09/27/2020 CLINICAL DATA:  Respiratory distress, intubated patient EXAM: PORTABLE CHEST 1 VIEW COMPARISON:  09/26/2020 FINDINGS: Endotracheal tube is approximately 2 cm above the carina. Enteric tube passes into the fundus of the stomach. There is a right apically directed chest tube. Similar pneumothorax on the right. Partially imaged right chest wall emphysema. Increased bilateral opacities. Bibasilar atelectasis. Similar cardiomediastinal contours. IMPRESSION: 1. Lines and tubes as above. 2. Similar right pneumothorax. 3. Increased bilateral opacities may reflect pulmonary edema. Bibasilar atelectasis. Electronically Signed   By: Macy Mis M.D.   On: 09/27/2020 10:29   DG CHEST PORT 1 VIEW  Result Date: 09/26/2020 CLINICAL DATA:  Follow-up pneumothorax EXAM: PORTABLE CHEST 1 VIEW COMPARISON:  09/26/2020, 09/25/2020 FINDINGS: Endotracheal tube tip about 3.6 cm superior to the carina. Esophageal tube tip beneath left hemidiaphragm. Right chest tube similar in position. Moderate chest wall emphysema on the right. Stable to slight interval increase in size of right lateral, basilar and CP angle pneumothorax. Atelectasis of the right lower lung. Streaky airspace opacities at the left base. Stable cardiomediastinal silhouette. IMPRESSION: Stable to slight interval increase in size of right lateral, basilar and CP angle pneumothorax. Right chest tube remains in place. Continued right lower lung atelectasis. Similar streaky airspace disease left lung base. Electronically Signed   By: Donavan Foil M.D.   On: 09/26/2020 18:32   DG CHEST PORT 1 VIEW  Result Date: 09/26/2020 CLINICAL DATA:  Chest tube EXAM: PORTABLE CHEST 1 VIEW COMPARISON:  Portable exam 0839 hours compared to 09/25/2020 FINDINGS: Tip  of endotracheal tube projects 2.5 cm above carina. Nasogastric tube extends  to distal esophagus; recommend advancing tube 10 cm to place proximal side-port within stomach. RIGHT thoracostomy tube unchanged. Enlargement of cardiac silhouette with slight vascular congestion. Atherosclerotic calcification aorta. Persistent RIGHT basilar and tiny RIGHT upper lateral pneumothorax despite thoracostomy tube. Atelectasis of RIGHT lung base. Patchy infiltrates in LEFT upper and LEFT lower lungs increased since previous exam. No definite pleural effusion or acute osseous findings. IMPRESSION: Persistent RIGHT pneumothorax and basilar atelectasis despite thoracostomy tube. Increased LEFT lung infiltrates. Recommend advancing nasogastric tube 10 cm. Electronically Signed   By: Lavonia Dana M.D.   On: 09/26/2020 10:19   DG CHEST PORT 1 VIEW  Result Date: 09/25/2020 CLINICAL DATA:  Chest tube. EXAM: PORTABLE CHEST 1 VIEW COMPARISON:  Chest x-ray 09/25/2020.  CT 09/25/2020. FINDINGS: Endotracheal tube, NG tube, right chest tube in stable position. Stable small right pneumothorax. Persistent bibasilar infiltrates. Left upper lobe nodule best identified by prior CT. No prominent pleural effusion. Mild right chest wall subcutaneous emphysema again noted. IMPRESSION: 1. Lines and tubes including right chest tube in stable position. Stable small right pneumothorax. 2.  Persistent bibasilar infiltrates without interim change. 3.  Left upper lobe nodule best identified by prior CT. Electronically Signed   By: Marcello Moores  Register   On: 09/25/2020 08:47   DG Chest Port 1 View  Result Date: 09/25/2020 CLINICAL DATA:  Chest tube placement EXAM: PORTABLE CHEST 1 VIEW COMPARISON:  09/24/2020 FINDINGS: Interval placement of large bore right chest tube. Continued small CP angle and basilar pneumothorax. Endotracheal tube and NG tube are unchanged. Airspace disease in the lower lobes, increasing on the left since prior study. No effusions.  Heart is normal size. IMPRESSION: Interval placement of right chest tube. Partial re-expansion of the lung with mild residual right CP angle and basilar pneumothorax. Bibasilar airspace opacities, worsening on the left since prior study. Electronically Signed   By: Rolm Baptise M.D.   On: 09/25/2020 02:44   DG CHEST PORT 1 VIEW  Result Date: 09/24/2020 CLINICAL DATA:  Hypoxemia EXAM: PORTABLE CHEST 1 VIEW COMPARISON:  09/24/2020, 09/23/2020, CT 01/23/2019 FINDINGS: Endotracheal tube tip is about 3.8 cm superior to the carina. Esophageal tube tip below the diaphragm but incompletely visualized. Emphysematous disease. Streaky atelectasis or scarring left base. Stable cardiomediastinal silhouette. Right lower small bore chest tube remains in place. Similar small residual right CP angle and basilar pneumothorax. Atelectasis and airspace disease at the medial right base. IMPRESSION: No significant interval change in small right CP angle and basilar pneumothorax. Atelectasis and consolidation right lower lobe as before. Emphysema Electronically Signed   By: Donavan Foil M.D.   On: 09/24/2020 23:28   DG CHEST PORT 1 VIEW  Result Date: 09/24/2020 CLINICAL DATA:  Follow-up pneumothorax EXAM: PORTABLE CHEST 1 VIEW COMPARISON:  Films from earlier in the same day. FINDINGS: Small bore chest tube is again identified with further decrease in the size of the right pneumothorax. Only a lateral basilar component remains. Consolidation in the right lower lobe is again noted and stable. Endotracheal tube and gastric catheter are seen in satisfactory position. Cardiac shadow is stable. IMPRESSION: Further decrease in right-sided pneumothorax. Electronically Signed   By: Inez Catalina M.D.   On: 09/24/2020 21:17   DG CHEST PORT 1 VIEW  Result Date: 09/24/2020 CLINICAL DATA:  Follow-up pneumothorax. EXAM: PORTABLE CHEST 1 VIEW COMPARISON:  Earlier film, same date. FINDINGS: There is a small caliber chest tube noted at the  right base. Interval decrease in size of the pneumothorax. This was  previously 50% and is now approximately the 25%. Persistent right lower lobe atelectasis. No pleural effusions. IMPRESSION: Interval decrease in size of the right-sided pneumothorax, now approximately 25%. Electronically Signed   By: Marijo Sanes M.D.   On: 09/24/2020 18:32   DG CHEST PORT 1 VIEW  Result Date: 09/24/2020 CLINICAL DATA:  Status post intubation EXAM: PORTABLE CHEST 1 VIEW COMPARISON:  09/24/2020 FINDINGS: Endotracheal tube and gastric catheter are noted in satisfactory position. Cardiac shadows within normal limits. Aortic calcifications are again noted. Right lung shows further consolidation and collapse of the right lower lobe. Additionally there is a large right-sided pneumothorax identified when compared with the film from 2 hours previous. Left lung remains clear. IMPRESSION: Large right-sided pneumothorax with mild tension component. Nasogastric catheter and endotracheal tube are in satisfactory position. These results will be called to the ordering clinician or representative by the Radiologist Assistant, and communication documented in the PACS or [employer]. Electronically Signed   By: Inez Catalina M.D.   On: 09/24/2020 15:43   DG Chest Port 1 View  Result Date: 09/24/2020 CLINICAL DATA:  Intubated, pneumonia, pleural effusion EXAM: PORTABLE CHEST 1 VIEW COMPARISON:  Chest radiograph from one day prior. FINDINGS: Endotracheal tube tip is 6.4 cm above the carina. Enteric tube enters stomach with the tip not seen on this image. Stable cardiomediastinal silhouette with normal heart size. No pneumothorax. Mild blunting of the costophrenic angles bilaterally, slightly increased on the right and stable on the left. Patchy right middle lobe consolidation is similar, accounting for overall decreased lung volumes. Similar hazy left retrocardiac opacity. IMPRESSION: 1. Well-positioned support structures. 2. Probable  small bilateral pleural effusions, slightly increased on the right and stable on the left. 3. Stable patchy right middle lobe consolidation, compatible with pneumonia. Similar hazy left retrocardiac opacity, either atelectasis or pneumonia. Electronically Signed   By: Ilona Sorrel M.D.   On: 09/24/2020 13:56   DG CHEST PORT 1 VIEW  Result Date: 09/23/2020 CLINICAL DATA:  Check gastric catheter placement as well as pneumonia EXAM: PORTABLE CHEST 1 VIEW COMPARISON:  09/16/2020 FINDINGS: Cardiac shadow is stable. Aortic calcifications are seen. Endotracheal tube and gastric catheter are noted in satisfactory position. Lungs are well aerated bilaterally. Persistent right middle lobe pneumonia is seen and stable. Left lung remains clear. No bony abnormality is seen. IMPRESSION: Stable right basilar pneumonia. Endotracheal tube and nasogastric catheter in satisfactory position. Electronically Signed   By: Inez Catalina M.D.   On: 09/23/2020 00:08   DG Chest Port 1 View  Result Date: 09/23/2020 CLINICAL DATA:  Sepsis, respiratory failure EXAM: PORTABLE CHEST 1 VIEW COMPARISON:  04/07/2020 FINDINGS: Endotracheal tube seen 5.9 cm above the carina. Nasogastric tube tip is just beyond the gastroesophageal junction with its proximal side hole position within the distal esophagus. The lungs are hyperinflated in keeping with changes of underlying COPD. There is superimposed right basilar consolidation in keeping with acute lobar pneumonia. No pneumothorax or pleural effusion. Cardiac size within normal limits. No acute bone abnormality. IMPRESSION: Endotracheal tube in appropriate position. Nasogastric tube advancement by 10-15 cm is recommended for optimal positioning. Right basilar consolidation in keeping with acute lobar pneumonia. COPD. Electronically Signed   By: Fidela Salisbury MD   On: 09/17/2020 19:14   DG Abd Portable 1V  Result Date: 09/26/2020 CLINICAL DATA:  OG tube placement. EXAM: PORTABLE ABDOMEN - 1  VIEW COMPARISON:  No prior. FINDINGS: OG tube noted with tip coiled in stomach. No gastric distention. Bibasilar pulmonary infiltrates  noted. Degenerative change thoracic spine. IMPRESSION: 1.  OG tube noted with tip coiled in the stomach. 2.  Bibasilar pulmonary infiltrates. Electronically Signed   By: Marcello Moores  Register   On: 09/26/2020 12:44    Microbiology Recent Results (from the past 240 hour(s))  Culture, Respiratory w Gram Stain     Status: None (Preliminary result)   Collection Time: 10/04/20  9:20 AM   Specimen: Tracheal Aspirate; Respiratory  Result Value Ref Range Status   Specimen Description TRACHEAL ASPIRATE  Final   Special Requests NONE  Final   Gram Stain   Final    ABUNDANT WBC PRESENT,BOTH PMN AND MONONUCLEAR RARE YEAST    Culture   Final    CULTURE REINCUBATED FOR BETTER GROWTH Performed at [facility], [place] [address], [place], [place] [address]    Report Status PENDING  Incomplete    Lab Basic Metabolic Panel: Recent Labs  Lab 09/29/20 0550 09/30/20 0216 10/01/20 0041 10/01/20 0908 10/02/20 0237 10/02/20 1048 10/03/20 0014 10/03/20 3235 10/04/20 0052 10/04/20 0927 Oct 25, 2020 0034  NA 143   < > 149*   < > 152*   < > 152* 151* 147* 147* 146*  K 4.7   < > 4.3   < > 4.6   < > 4.1 3.9 3.7 4.5 3.8  CL 94*   < > 99  --  101  --  100  --  96*  --  95*  CO2 42*   < > 43*  --  40*  --  46*  --  45*  --  42*  GLUCOSE 139*   < > 137*  --  157*  --  149*  --  144*  --  156*  BUN 68*   < > 69*  --  78*  --  96*  --  106*  --  83*  CREATININE 1.06   < > 1.07  --  1.12  --  1.27*  --  1.29*  --  1.10  CALCIUM 9.1   < > 9.0  --  9.5  --  9.1  --  8.9  --  8.5*  MG 2.5*  --   --   --  2.8*  --  2.7*  --  2.8*  --   --   PHOS  --   --   --   --   --   --  3.2  --  3.6  --   --    < > = values in this interval not displayed.   Liver Function Tests: Recent Labs  Lab 10/01/20 0041 10/02/20 0237 10/03/20 0014 10/25/2020 0034  AST 55* 42* 31 69*  ALT 74* 60*  53* 75*  ALKPHOS 75 77 70 58  BILITOT 0.6 0.6 0.5 0.6  PROT 6.0* 6.0* 5.8* 5.8*  ALBUMIN 2.5* 2.4* 2.3* 2.4*   No results for input(s): LIPASE, AMYLASE in the last 168 hours. No results for input(s): AMMONIA in the last 168 hours. CBC: Recent Labs  Lab 09/29/20 0550 09/30/20 0216 09/30/20 1200 10/02/20 0237 10/02/20 0850 10/02/20 1048 10/03/20 0014 10/03/20 5732 10/04/20 0052 10/04/20 0927 10/25/2020 0034  WBC 15.8* 15.5*   < > 14.8* 14.7*  --  13.9*  --  17.1*  --  17.7*  NEUTROABS 14.2* 14.4*  --   --  13.3*  --   --   --   --   --   --   HGB 13.0 13.2   < >  14.7 14.6   < > 13.5 14.3 12.7* 13.9 12.3*  HCT 45.7 47.4   < > 54.6* 53.3*   < > 49.2 42.0 45.9 41.0 44.9  MCV 108.8* 109.7*   < > 114.2* 114.1*  --  112.8*  --  112.0*  --  114.2*  PLT 130* 141*   < > 167 154  --  142*  --  144*  --  PLATELET CLUMPS NOTED ON SMEAR, UNABLE TO ESTIMATE   < > = values in this interval not displayed.   Cardiac Enzymes: No results for input(s): CKTOTAL, CKMB, CKMBINDEX, TROPONINI in the last 168 hours. Sepsis Labs: Recent Labs  Lab 10/01/20 0342 10/02/20 0237 10/02/20 0850 10/03/20 0014 10/04/20 0052 2020/10/17 0034  WBC  --    < > 14.7* 13.9* 17.1* 17.7*  LATICACIDVEN 1.0  --   --   --   --   --    < > = values in this interval not displayed.    Procedures/Operations     [employer] 2020/10/17, 1:15 PM

## 2020-10-06 NOTE — Progress Notes (Signed)
NAME:  Paul Branch, MRN:  [id number], DOB:  Apr 19, 1956, LOS: 44 ADMISSION DATE:  10/04/2020, CONSULTATION DATE:  09/08/2020 REFERRING MD:  ED CHIEF COMPLAINT:  Respiratory distress  History of Present Illness:  Paul Branch is a 65 year old gentleman with PMH significant for COPD on 2L home oxygen, nicotine dependency, hypertension and hyperlipidemia who presented to [facility] 4/17 via EMS with worsening dyspnea. The patient's wife reported that he had become more short of breath & mental status became impaired so EMS was called. He was found to be hypoxic with saturations of 70% on 2 liters nasal cannula Oxygen. The patient was initially placed on 100% FIO2 but soon afterward intubated because of hypercarbia and hypoxia. His COVID 19 and Influenza panel were negative. It is reported that patient received diuretic therapy in the ED with 1800 ml diuresis followed by hypotension.  Hence he was given IVF and required Levophed post intubation.  CXR is consistent with right lobar pneumonia. He was transferred to [facility] for further care.  On 4/19, he was noted to have changes on mechanical ventilation worrisome for ETT obstruction, ETT was exchanged. Post exchange film with pneumothorax requiring chest tube placement.   Pertinent  Medical History  COPD, chronic respiratory failure with hypercapnia and hypoxemia  Significant Hospital Events:   . 4/17 admitted, intubated CAP, COPD exacerbation, CTX/Azithro . 4/18 Alert communicative, needs more sedation although not fighting vent . 4/19 Agitated, poor ventilation noted on waveforms/volumes via vent, peak pressures mid 50s, distal obstruction in ETT (unable to pass suction catheter) with high peak:plateau consistent with airway resistance, refractory hypoxemia so ETT exchanged via bougie, fu CXR with R PTX, small bore tube placed without complete re-expansion, worsening hypoxemia overnight, CTA chest with PTX, LLL consolidation, RLL atelectasis,  No PE, 46F surgical CT placed, hypoxemia slowly improved, still on 100%/PEEP 10 . 4/20 Sats in 80s most of day, improved overnight to 4/21 weaning FiO2 slightly . 4/21 Sats in low 90s on 100% . 4/22 unchanged, diuresed . 4/23 unchanged, diuresed . 4/24 Sats mildly better, PTX appears nearly resolved which is improved, continue diuresis. BM's better. . 4/25: Had to increase FiO2 to 100% overnight for sat drops, now back to 85%, BP labile on Precedex, Good diuresis . 4/26: Chest tube was placed under waterseal, repeat x-ray chest showed a stable very small pneumothorax, remains on high vent setting with rising PCO2 . 4/29 CT to water seal, Pneumo resolved per CXR 4/29, remains on 90% per vent, tachy, awaiting family to make decision about comfort care vs trach.  . 4/30: Hypoxic/hypercapnic.  Interim History / Subjective:  Overnight patient started getting hypoxic and severely hypercapnic with pH 7.2/7.18, despite maximal ventilatory therapy his gas exchange has not improved X-ray chest was done which showed no pneumothorax, stable bilateral pulmonary edema  Objective   Blood pressure 128/82, pulse (!) 114, temperature 97.8 F (36.6 C), temperature source Axillary, resp. rate (!) 33, height 5\' 11"  (1.803 m), weight 85.2 kg, SpO2 (!) 87 %.    Vent Mode: PRVC FiO2 (%):  [85 %-100 %] 100 % Set Rate:  [32 bmp] 32 bmp Vt Set:  [460 mL-500 mL] 500 mL PEEP:  [10 cmH20] 10 cmH20 Plateau Pressure:  [24 cmH20-31 cmH20] 31 cmH20   Intake/Output Summary (Last 24 hours) at 10/31/2020 0928 Last data filed at 2020/10/31 0900 Gross per 24 hour  Intake 4951.54 ml  Output 2090 ml  Net 2861.54 ml   [employer]  10/02/20 0500 10/03/20 0406 Oct 14, 2020 0353  Weight: 84.3 kg 85 kg 85.2 kg    Examination: General: Elderly African-American male, lying in the bed, orally intubated, in severe respiratory distress, tachypneic HEENT: ETT/OG secure and  in place, tube feeds infusing  Neuro: Eyes closed, not  following commands CV: Tachycardic, no MRG, accentuated S1 PULM: Tachypneic, bilateral chest excursion, Diminished breath sounds per bases, vent assisted breaths, Rhonchi, no wheeze,  Right-sided large bore chest tube in place no air leak noted to water seal GI: soft, non-distended, active bowel sounds  GU: Flexiseal Extremities: 1-2 +generalized edema  Skin: warm, dry, intact    Resolved Hospital Problem list    Elevated troponin due to demand ischemia Obstructive Shock due to PTX  Assessment & Plan:   Acute on Chronic Hypoxic and Hypercarbic Respiratory Failure secondary to Community Acquired Right Lobar Pneumonia (CAP, NOS) and acute COPD exacerbation Right Pneumothorax  Acute respiratory acidosis CT significant underlying emphysema, large shunt via PNA Developed ETT obstruction 4/19, exchanged. Post film showed PTX on Rt. Tracheal aspirate negative.Treated with CTX, Azithro from 4/17 - 4/22. HD 15 on the ventilator Continue chest tube to waterseal Trend CXR and ABG  VAP prevention measures  Continue diuresis with Lasix and acetazolamide Overnight patient is started retaining CO2, pH got worse to 7.2, despite changes to maximum ventilatory therapy, his ABGs did not improve, last pH was 7.18 with PCO2 > 120 His current O2 sat is in the high 70s on 100% FiO2 with PEEP of 10  Septic shock due to pneumonia Patient completed antibiotic therapy His white count started increasing, x-ray chest is still consistent with bilateral infiltrates Tracheal aspirate was sent yesterday, cultures pending Started on IV Zosyn Currently requiring Levophed to maintain MAP of 65  AKI, improving Hypernatremia  ATN from hypotension, received contrast with CTA.  Patient serum creatinine has improved to 1.1 Serum sodium is trending trending down with free water flushes Trend BMP/urinary output Replace electrolytes as indicated Continue free water flushes Avoid nephrotoxic agents, ensure adequate  renal perfusion  Pulmonary Nodule L Upper Lobe, Concerning for malignancy Increased in size from 1.8 cm to 2.6 cm in 2 years, suspicious for adeno Pt is high risk current every day smoker  Consider bronchoscopy for tissue sampling > Currently with ongoing goals of care discussions with family.   Hyperlipidemia continue lipitor   Best practice (right click and "Reselect all SmartList Selections" daily)  Diet:  Tube Feed  Pain/Anxiety/Delirium protocol (if indicated): Yes (RASS goal -2) VAP protocol (if indicated): Yes DVT prophylaxis: LMWH GI prophylaxis: PPI Glucose control:  SSI Yes Central venous access:  N/A Arterial line:  N/A Foley: Yes but discontinue today Mobility:  bed rest  PT consulted: N/A Last date of multidisciplinary goals of care discussion -spoke with patient's son this morning as patient is started the setting into 70s, he is retaining CO2 with a low pH.  Considering that amount of hypoxemia we canceled tracheostomy, advised patient's son that he should bring his family again to have continued goals of care discussion as patient's prognosis is very poor and he may not survive with severity of hypoxemia even 1 to 2 days.  He agrees to start him on fentanyl infusion but continue treatment and keep him full code, patient's family will arrive at bedside in couple of hours Code Status:  full code Disposition: ICU   Total critical care time: 35 minutes  Performed by: [place] care time was exclusive of separately  billable procedures and treating other patients.   Critical care was necessary to treat or prevent imminent or life-threatening deterioration.   Critical care was time spent personally by me on the following activities: development of treatment plan with patient and/or surrogate as well as nursing, discussions with consultants, evaluation of patient's response to treatment, examination of patient, obtaining history from patient or surrogate,  ordering and performing treatments and interventions, ordering and review of laboratory studies, ordering and review of radiographic studies, pulse oximetry and re-evaluation of patient's condition.   Jacky Kindle MD [facility] See Amion for pager If no response to pager, please call [phone] until 7pm After 7pm, Please call E-link [phone]

## 2020-10-06 NOTE — Progress Notes (Signed)
[facility] Progress Note Patient Name: Paul Branch DOB: [birth year] MRN: [id number]   Date of Service  2020-10-18  HPI/Events of Note  ABG on 100%/PRVC 32/TV 500/P 10 = 7.18/>120/72.7/48.3. No real room to increase Rate or TV further. Will decrease Diamox dose.  eICU Interventions  Plan: 1. Decrease Diamox to 250 mg per tube BID.     Intervention Category Major Interventions: Acid-Base disturbance - evaluation and management;Respiratory failure - evaluation and management  Maryalice Pasley Cornelia Copa 2020-10-18, 6:45 AM

## 2020-10-06 NOTE — Treatment Plan (Signed)
Poke with patient's Son and explained that patient's Oxygen saturation is in 37's and it will cause his heart to stop and I strongly recommend that we should not compress his chest and let him pass peacefully. He agreed and decided to proceed with comfort care and keep him DNR.  DNR orders were written.   Jacky Kindle MD [facility] See Amion for pager If no response to pager, please call [phone] until 7pm After 7pm, Please call E-link [phone]

## 2020-10-06 NOTE — Progress Notes (Signed)
Link Physician-Brief Progress Note Patient Name: Paul Branch DOB: 07/27/1955 MRN: [id number]   Date of Service  Oct 24, 2020  HPI/Events of Note  ABG on 100%/PRVC 32/TV 460/P 10 = 7.209/>120/98.1  eICU Interventions  Plan: 1. Portable CXR STAT. 2. Will ask PCCM ground team to evaluate the patient at bedside.     Intervention Category Major Interventions: Acid-Base disturbance - evaluation and management;Respiratory failure - evaluation and management  Tori Cupps Cornelia Copa 10-24-2020, 4:22 AM

## 2020-10-06 NOTE — Progress Notes (Signed)
Patient expired at 1058. This RN and Shanon Brow, RN (charge) auscultated for breath and heart sounds for a full minute to ensure time of death. Dr. Tacy Learn notified. Dr. Tacy Learn then called family to inform them of the patient's passing. [place] called at [employer] number [phone].

## 2020-10-06 DEATH — deceased

## 2020-10-07 LAB — CULTURE, RESPIRATORY W GRAM STAIN

## 2020-10-15 MED FILL — Medication: Qty: 1 | Status: AC

## 2023-04-24 IMAGING — DX DG CHEST 1V PORT
1 series · 1 of 1 positions shown · non-contrast
Comparison: Portable chest 10/01/2020 and earlier.

CLINICAL DATA: 64-year-old male status post sepsis, respiratory
failure, right pneumothorax.

EXAM:
PORTABLE CHEST 1 VIEW

[chest]
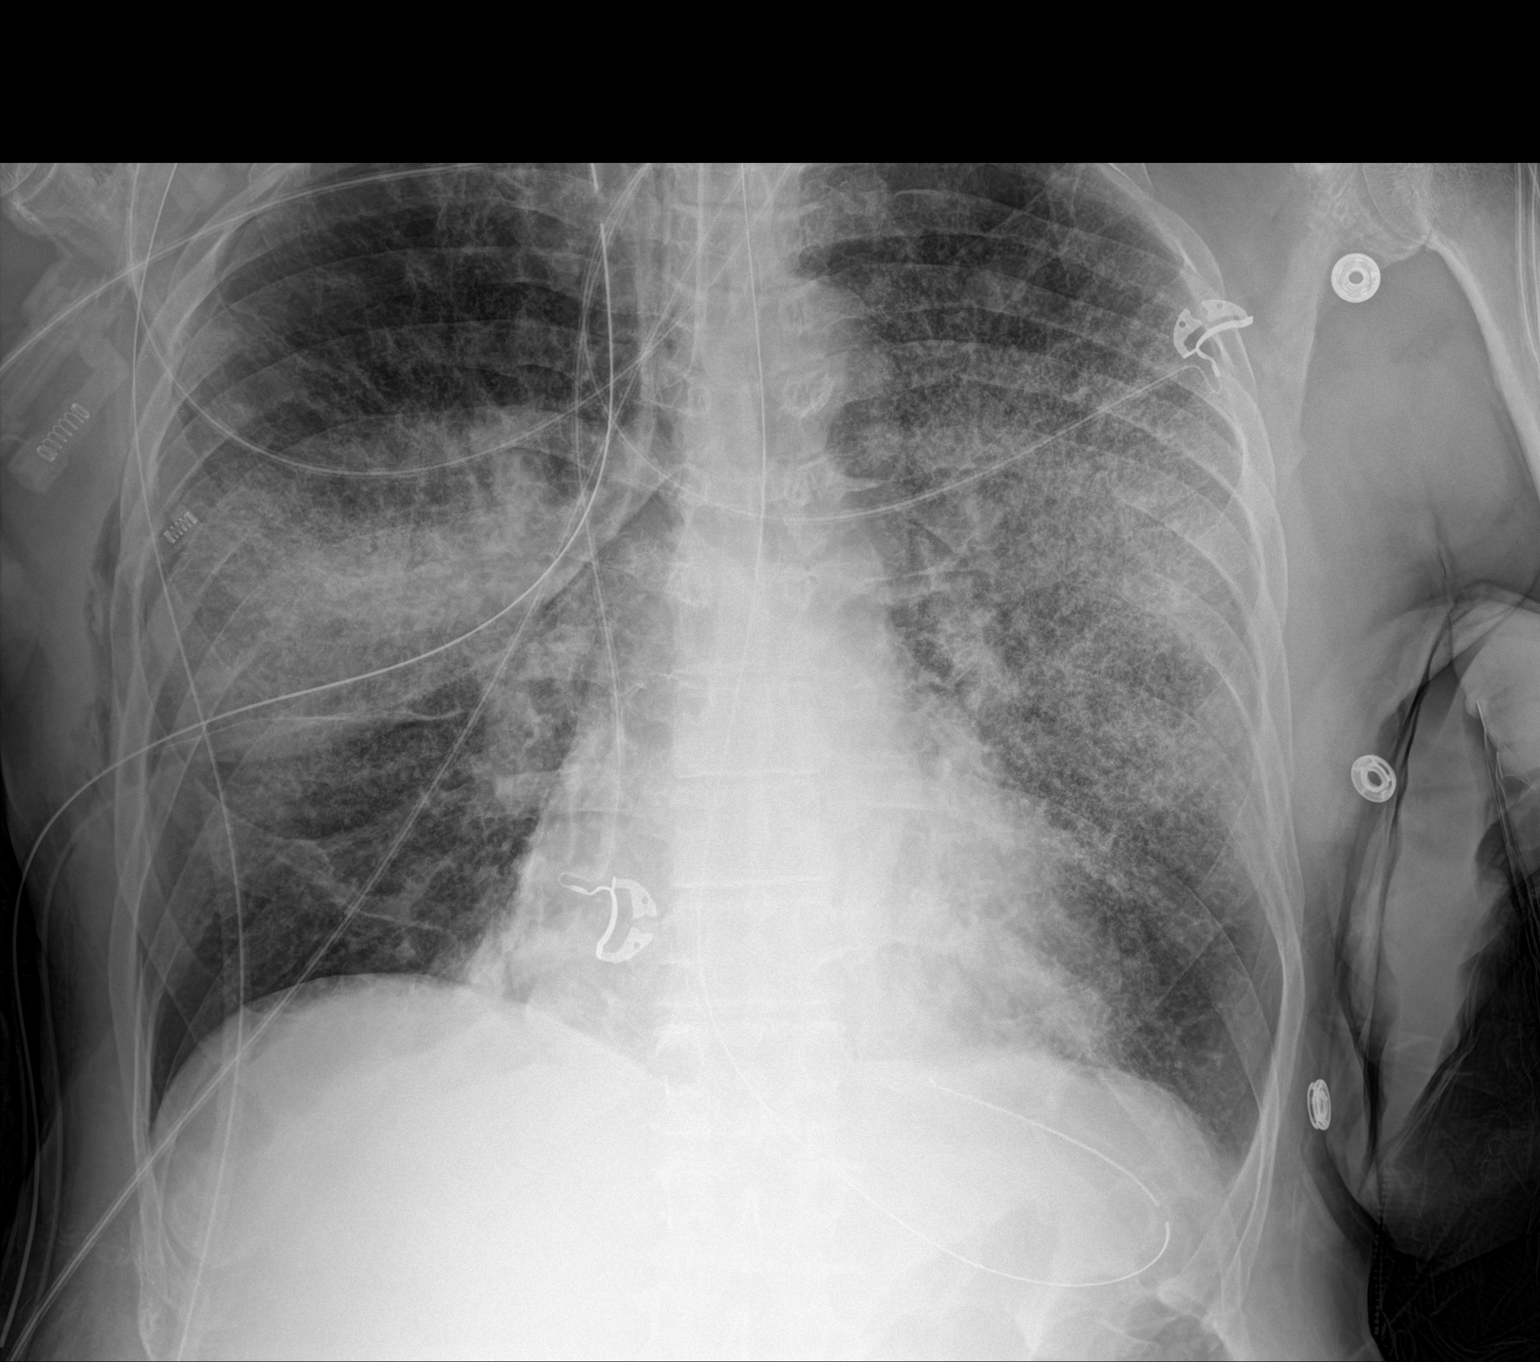

[1 of 1 positions shown; findings below may reference images not displayed]

FINDINGS: Portable AP semi upright view at 3983 hours. Stable right chest tube
coursing to the medial right lung apex. Stable small residual right
pneumothorax, pleural edge visible laterally.

Endotracheal tube tip in good position between the clavicles and
carina. Stable enteric tube looped in the left upper quadrant.

Stable lung volumes. Mediastinal contours remain within normal
limits.

Coarse bilateral perihilar and more generalized reticulonodular
opacity persists. Ventilation not significantly changed since
09/29/2020. No new pulmonary abnormality.

Negative visible bowel gas pattern. No acute osseous abnormality
identified.
IMPRESSION: 1.  Stable lines and tubes.   Stable small right pneumothorax.
2. Ventilation not significantly changed from 09/29/2020. Confluent
perihilar and background coarse reticulonodular opacity in both
lungs.
